# Patient Record
Sex: Female | Born: 1990 | Race: Black or African American | Hispanic: No | Marital: Single | Smoking: Former smoker
Health system: Southern US, Community
[De-identification: ages and names within clinical notes are randomized; demographics above are authoritative.]

## PROBLEM LIST (undated history)

## (undated) ENCOUNTER — Inpatient Hospital Stay: Payer: Self-pay

## (undated) ENCOUNTER — Inpatient Hospital Stay (HOSPITAL_COMMUNITY): Payer: Self-pay

## (undated) DIAGNOSIS — Z9889 Other specified postprocedural states: Secondary | ICD-10-CM

## (undated) DIAGNOSIS — R519 Headache, unspecified: Secondary | ICD-10-CM

## (undated) DIAGNOSIS — R51 Headache: Secondary | ICD-10-CM

## (undated) DIAGNOSIS — R112 Nausea with vomiting, unspecified: Secondary | ICD-10-CM

## (undated) DIAGNOSIS — Z3490 Encounter for supervision of normal pregnancy, unspecified, unspecified trimester: Secondary | ICD-10-CM

## (undated) DIAGNOSIS — D649 Anemia, unspecified: Secondary | ICD-10-CM

## (undated) DIAGNOSIS — R87629 Unspecified abnormal cytological findings in specimens from vagina: Secondary | ICD-10-CM

## (undated) DIAGNOSIS — C539 Malignant neoplasm of cervix uteri, unspecified: Secondary | ICD-10-CM

## (undated) HISTORY — PX: LEEP: SHX91

---
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
## 2012-01-25 ENCOUNTER — Emergency Department: Payer: Self-pay | Admitting: Emergency Medicine

## 2012-05-08 ENCOUNTER — Emergency Department: Payer: Self-pay | Admitting: Emergency Medicine

## 2012-09-22 ENCOUNTER — Emergency Department: Payer: Self-pay | Admitting: Emergency Medicine

## 2012-09-22 LAB — CBC
HCT: 38 % (ref 35.0–47.0)
HGB: 12.9 g/dL (ref 12.0–16.0)
MCH: 29.7 pg (ref 26.0–34.0)
MCHC: 34.1 g/dL (ref 32.0–36.0)
MCV: 87 fL (ref 80–100)
RDW: 12.5 % (ref 11.5–14.5)
WBC: 9 10*3/uL (ref 3.6–11.0)

## 2012-09-22 LAB — URINALYSIS, COMPLETE
Bilirubin,UR: NEGATIVE
Blood: NEGATIVE
Glucose,UR: NEGATIVE mg/dL (ref 0–75)
Ketone: NEGATIVE
Nitrite: NEGATIVE
Protein: NEGATIVE
Specific Gravity: 1.015 (ref 1.003–1.030)
Squamous Epithelial: <1
WBC UR: <1 /HPF (ref 0–5)

## 2012-09-22 LAB — COMPREHENSIVE METABOLIC PANEL
Alkaline Phosphatase: 80 U/L (ref 50–136)
Anion Gap: 8 (ref 7–16)
Calcium, Total: 9.2 mg/dL (ref 8.5–10.1)
Chloride: 112 mmol/L — ABNORMAL HIGH (ref 98–107)
Co2: 22 mmol/L (ref 21–32)
EGFR (African American): >60
Osmolality: 283 (ref 275–301)
SGOT(AST): 20 U/L (ref 15–37)
Sodium: 142 mmol/L (ref 136–145)

## 2012-09-22 LAB — LIPASE, BLOOD: Lipase: 156 U/L (ref 73–393)

## 2012-09-23 ENCOUNTER — Emergency Department: Payer: Self-pay | Admitting: Emergency Medicine

## 2013-05-07 ENCOUNTER — Emergency Department: Payer: Self-pay | Admitting: Emergency Medicine

## 2013-09-05 ENCOUNTER — Ambulatory Visit: Payer: Self-pay | Admitting: Physician Assistant

## 2013-09-05 LAB — PREGNANCY, URINE: Pregnancy Test, Urine: NEGATIVE m[IU]/mL

## 2013-09-05 LAB — RAPID STREP-A WITH REFLX: Micro Text Report: NEGATIVE

## 2013-09-08 LAB — BETA STREP CULTURE(ARMC)

## 2013-11-05 ENCOUNTER — Ambulatory Visit: Payer: Self-pay | Admitting: Family Medicine

## 2013-11-05 LAB — URINALYSIS, COMPLETE
Bilirubin,UR: NEGATIVE
Blood: NEGATIVE
Glucose,UR: NEGATIVE mg/dL (ref 0–75)
KETONE: NEGATIVE
Leukocyte Esterase: NEGATIVE
Nitrite: NEGATIVE
PROTEIN: NEGATIVE
Ph: 7 (ref 4.5–8.0)
SPECIFIC GRAVITY: 1.01 (ref 1.003–1.030)

## 2013-11-05 LAB — RAPID INFLUENZA A&B ANTIGENS (ARMC ONLY)

## 2013-11-05 LAB — PREGNANCY, URINE: Pregnancy Test, Urine: NEGATIVE m[IU]/mL

## 2013-11-08 ENCOUNTER — Emergency Department: Payer: Self-pay | Admitting: Emergency Medicine

## 2013-11-08 LAB — URINE CULTURE

## 2014-06-30 ENCOUNTER — Emergency Department: Payer: Self-pay | Admitting: Emergency Medicine

## 2014-08-17 DIAGNOSIS — E663 Overweight: Secondary | ICD-10-CM | POA: Insufficient documentation

## 2014-08-17 LAB — OB RESULTS CONSOLE HIV ANTIBODY (ROUTINE TESTING): HIV: NONREACTIVE

## 2014-08-17 LAB — HM HIV SCREENING LAB: HM HIV Screening: NEGATIVE

## 2014-08-17 LAB — OB RESULTS CONSOLE HEPATITIS B SURFACE ANTIGEN: HEP B S AG: NEGATIVE

## 2014-09-14 ENCOUNTER — Encounter: Payer: Self-pay | Admitting: Maternal & Fetal Medicine

## 2014-09-23 DIAGNOSIS — R87619 Unspecified abnormal cytological findings in specimens from cervix uteri: Secondary | ICD-10-CM | POA: Insufficient documentation

## 2014-09-23 DIAGNOSIS — O9A419 Sexual abuse complicating pregnancy, unspecified trimester: Secondary | ICD-10-CM

## 2014-09-23 DIAGNOSIS — G43909 Migraine, unspecified, not intractable, without status migrainosus: Secondary | ICD-10-CM | POA: Insufficient documentation

## 2014-09-23 HISTORY — DX: Sexual abuse complicating pregnancy, unspecified trimester: O9A.419

## 2014-09-30 LAB — OB RESULTS CONSOLE RUBELLA ANTIBODY, IGM
Rubella: IMMUNE
Rubella: NON-IMMUNE/NOT IMMUNE

## 2014-09-30 LAB — OB RESULTS CONSOLE VARICELLA ZOSTER ANTIBODY, IGG: Varicella: IMMUNE

## 2014-12-17 ENCOUNTER — Encounter: Payer: Self-pay | Admitting: Maternal & Fetal Medicine

## 2015-01-07 ENCOUNTER — Encounter
Admit: 2015-01-07 | Disposition: A | Discharge status: Home / Self Care | Payer: Self-pay | Attending: Obstetrics & Gynecology | Admitting: Obstetrics & Gynecology

## 2015-01-21 ENCOUNTER — Encounter
Admit: 2015-01-21 | Disposition: A | Discharge status: Home / Self Care | Payer: Self-pay | Attending: Obstetrics and Gynecology | Admitting: Obstetrics and Gynecology

## 2015-01-24 NOTE — Consult Note (Signed)
Referral Information:  Reason for Referral 24 yo gravida 2 para 0010 at 25 weeks 4 days gestation by [redacted]w[redacted]d ultrasound is referred due to suspected 2 vessel cord.   Prenatal Hx First trimester screening 09/14/2014 - normal   Past Obstetrical Hx 05/09/2009 - SAb   Home Medications: Medication Instructions Status  Prenatal Multivitamins Prenatal Multivitamins oral tablet 1 tab(s) orally once a day Active   Allergies:   No Known Allergies:   Vital Signs/Notes:  Nursing Vital Signs: **Vital Signs.:   24-Mar-16 13:13  Vital Signs Type Routine  Temperature Temperature (F) 98.4  Celsius 36.8  Temperature Source oral  Pulse Pulse 105  Respirations Respirations 18  Systolic BP Systolic BP 389  Diastolic BP (mmHg) Diastolic BP (mmHg) 52  Mean BP 71  Pulse Ox % Pulse Ox % 97  Pulse Ox Activity Level  At rest  Oxygen Delivery Room Air/ 21 %   Perinatal Consult:  PGyn Hx abnormal Paps  LEEP  LEEP in 2013; Normal Paps subsequently    FHx Mother with DM, HTN; Father alive and well; Sister with GDM; MGM with history of stroke   Occupation Mother unemployed   Occupation Father works in [place]   Soc Hx occ [place]:  Subjective No cramping, leaking of water or bleeding; good fetal movement   Fever/Chills No   Cough No   Abdominal Pain No   Diarrhea No   Constipation No   Nausea/Vomiting No   SOB/DOE No   Chest Pain No   Dysuria No   Tolerating Diet Yes   Exam:  Today's Weight 219lbs;BMI=35    Additional Lab/Radiology Notes Early glucose screen was < 140  On ultrasound today, fetal anatomy is visualized and appears normal.  While the fetal umbilical arteries are difficult to visualize in cross sectional views of the umbilical cord, two umbilical arteries are visualized by Doppler ultrasound straddling the fetal bladder.    The estimated fetal weight, however is at the 6th percentile.  Amniotic fluid volume appears normal with a  MVP of 5.0 cm.  Umbilical artery Doppler S/D ratios averaged 3.03.  The upper limit of normal for this gestational age is 5.26.   Impression/Recommendations:  Impression 24 yo gravida 2 para 0010 at 25 weeks 4 days gestation by [redacted]w[redacted]d ultrasound with: 1. Fetal growth restriction 2. Obesity (BMI = 35) 3. Family history of diabetes (mother; sister with GDM)   Recommendations 1. Fetal growth restriction -- I recommended she reduce her activity and avoid smoke exposure. -- I recommended fetal surveillance to include a follow-up ultrasound for growth in 3 weeks, twice weekly testing starting now.  -- She can return here weekly for AFI, Dopplers and BPP, and to [facility] weekly for BPP. 2. Obesity (BMI = 35) -- Fetal surveillance as outline above and below 3. Family history of diabetes (mother; sister with GDM) -- She will have a follow-up glucose screen   Plan:  Ultrasound at what gestational ages Every 3 weeks starting now   Antepartum Testing Twice weekly, BPP twice a week until 30 weeks, then NSTs; AFI and Dopplers weekly   Comment/Plan Thank you for allowing [place] to participate in her care.    Total Time Spent with Patient 45 minutes   >50% of visit spent in couseling/coordination of care yes   Office Use Only [id number]  Level 3 (52min) NEW office consult detailed   Coding Description: FETAL - 2nd/3rd TRIMESTER INDICATION(S).   FGR -  Poor Fetal Growth.  Electronic Signatures: Dellia Nims (MD)  (Signed 24-Mar-16 15:03)  Authored: Referral, Home Medications, Allergies, Vital Signs/Notes, Consult, Exam, Lab/Radiology Notes, Impression, Plan, Billing, Coding Description   Last Updated: 24-Mar-16 15:03 by Dellia Nims (MD)

## 2015-01-28 ENCOUNTER — Ambulatory Visit
Admission: RE | Admit: 2015-01-28 | Discharge: 2015-01-28 | Disposition: A | Discharge status: Home / Self Care | Payer: Medicaid Other | Source: Ambulatory Visit | Attending: Obstetrics and Gynecology | Admitting: Obstetrics and Gynecology

## 2015-01-28 DIAGNOSIS — Z3A31 31 weeks gestation of pregnancy: Secondary | ICD-10-CM | POA: Insufficient documentation

## 2015-01-28 DIAGNOSIS — Q270 Congenital absence and hypoplasia of umbilical artery: Secondary | ICD-10-CM

## 2015-01-28 DIAGNOSIS — O26893 Other specified pregnancy related conditions, third trimester: Secondary | ICD-10-CM | POA: Insufficient documentation

## 2015-01-28 LAB — US OB FOLLOW UP

## 2015-02-10 ENCOUNTER — Observation Stay
Admission: EM | Admit: 2015-02-10 | Discharge: 2015-02-10 | Disposition: A | Discharge status: Home / Self Care | Payer: Medicaid Other | Attending: Obstetrics and Gynecology | Admitting: Obstetrics and Gynecology

## 2015-02-10 DIAGNOSIS — Z3A35 35 weeks gestation of pregnancy: Secondary | ICD-10-CM | POA: Diagnosis not present

## 2015-02-10 DIAGNOSIS — O26893 Other specified pregnancy related conditions, third trimester: Secondary | ICD-10-CM | POA: Diagnosis not present

## 2015-02-10 DIAGNOSIS — R109 Unspecified abdominal pain: Secondary | ICD-10-CM | POA: Insufficient documentation

## 2015-02-10 HISTORY — DX: Unspecified abnormal cytological findings in specimens from vagina: R87.629

## 2015-02-10 NOTE — OB Triage Note (Signed)
Preterm d/c instructions reviewed. AVS given and discussed. Pt d/c home.

## 2015-02-10 NOTE — Discharge Instructions (Signed)
Call provider or return to birthplace with:  1. Regular contractions 2. Leaking of fluid from your vaginal 3. Vaginal bleeding: Bright red or heavy like a period 4. Decreased Fetal movement

## 2015-02-10 NOTE — OB Triage Note (Signed)
Pt prenents to L&D with c/o constant abdominal pain acrross upper abd after vomiting episode x1 at 0000. Pt denies leaking fluid vaginal bleeding and reports fetal movement

## 2015-03-04 LAB — OB RESULTS CONSOLE GC/CHLAMYDIA
CHLAMYDIA, DNA PROBE: NEGATIVE
Gonorrhea: NEGATIVE

## 2015-03-04 LAB — OB RESULTS CONSOLE GBS: GBS: NEGATIVE

## 2015-03-04 LAB — OB RESULTS CONSOLE RPR: RPR: NONREACTIVE

## 2015-03-16 ENCOUNTER — Observation Stay
Admission: EM | Admit: 2015-03-16 | Discharge: 2015-03-16 | Disposition: A | Discharge status: Home / Self Care | Payer: Medicaid Other | Attending: Obstetrics and Gynecology | Admitting: Obstetrics and Gynecology

## 2015-03-16 DIAGNOSIS — R109 Unspecified abdominal pain: Secondary | ICD-10-CM | POA: Diagnosis present

## 2015-03-16 DIAGNOSIS — O26899 Other specified pregnancy related conditions, unspecified trimester: Secondary | ICD-10-CM | POA: Diagnosis not present

## 2015-03-16 NOTE — Discharge Instructions (Signed)
Braxton Hicks Contractions °Contractions of the uterus can occur throughout pregnancy. Contractions are not always a sign that you are in labor.  °WHAT ARE BRAXTON HICKS CONTRACTIONS?  °Contractions that occur before labor are called Braxton Hicks contractions, or false labor. Toward the end of pregnancy (32-34 weeks), these contractions can develop more often and may become more forceful. This is not true labor because these contractions do not result in opening (dilatation) and thinning of the cervix. They are sometimes difficult to tell apart from true labor because these contractions can be forceful and people have different pain tolerances. You should not feel embarrassed if you go to the hospital with false labor. Sometimes, the only way to tell if you are in true labor is for your health care provider to look for changes in the cervix. °If there are no prenatal problems or other health problems associated with the pregnancy, it is completely safe to be sent home with false labor and await the onset of true labor. °HOW CAN YOU TELL THE DIFFERENCE BETWEEN TRUE AND FALSE LABOR? °False Labor °· The contractions of false labor are usually shorter and not as hard as those of true labor.   °· The contractions are usually irregular.   °· The contractions are often felt in the front of the lower abdomen and in the groin.   °· The contractions may go away when you walk around or change positions while lying down.   °· The contractions get weaker and are shorter lasting as time goes on.   °· The contractions do not usually become progressively stronger, regular, and closer together as with true labor.   °True Labor °· Contractions in true labor last 30-70 seconds, become very regular, usually become more intense, and increase in frequency.   °· The contractions do not go away with walking.   °· The discomfort is usually felt in the top of the uterus and spreads to the lower abdomen and low back.   °· True labor can be  determined by your health care provider with an exam. This will show that the cervix is dilating and getting thinner.   °WHAT TO REMEMBER °· Keep up with your usual exercises and follow other instructions given by your health care provider.   °· Take medicines as directed by your health care provider.   °· Keep your regular prenatal appointments.   °· Eat and drink lightly if you think you are going into labor.   °· If Braxton Hicks contractions are making you uncomfortable:   °¨ Change your position from lying down or resting to walking, or from walking to resting.   °¨ Sit and rest in a tub of warm water.   °¨ Drink 2-3 glasses of water. Dehydration may cause these contractions.   °¨ Do slow and deep breathing several times an hour.   °WHEN SHOULD I SEEK IMMEDIATE MEDICAL CARE? °Seek immediate medical care if: °· Your contractions become stronger, more regular, and closer together.   °· You have fluid leaking or gushing from your vagina.   °· You have a fever.   °· You pass blood-tinged mucus.   °· You have vaginal bleeding.   °· You have continuous abdominal pain.   °· You have low back pain that you never had before.   °· You feel your baby's head pushing down and causing pelvic pressure.   °· Your baby is not moving as much as it used to.   °Document Released: 09/11/2005 Document Revised: 09/16/2013 Document Reviewed: 06/23/2013 °ExitCare® Patient Information ©2015 [employer]. This information is not intended to replace advice given to you by your health care   provider. Make sure you discuss any questions you have with your health care provider. ° °

## 2015-03-16 NOTE — OB Triage Note (Signed)
Patient complains of lower abdominal pain that is sharp.  Increases when baby moves.  Patient states, "I don't know if I'm having contractions."

## 2015-04-06 ENCOUNTER — Inpatient Hospital Stay
Admission: EM | Admit: 2015-04-06 | Discharge: 2015-04-12 | DRG: 766 | Disposition: A | Discharge status: Home / Self Care | Payer: Medicaid Other | Attending: Obstetrics and Gynecology | Admitting: Obstetrics and Gynecology

## 2015-04-06 DIAGNOSIS — O480 Post-term pregnancy: Principal | ICD-10-CM | POA: Diagnosis present

## 2015-04-06 DIAGNOSIS — Z3A41 41 weeks gestation of pregnancy: Secondary | ICD-10-CM | POA: Diagnosis present

## 2015-04-06 DIAGNOSIS — Z833 Family history of diabetes mellitus: Secondary | ICD-10-CM | POA: Diagnosis not present

## 2015-04-06 DIAGNOSIS — K660 Peritoneal adhesions (postprocedural) (postinfection): Secondary | ICD-10-CM | POA: Diagnosis present

## 2015-04-06 DIAGNOSIS — Z8249 Family history of ischemic heart disease and other diseases of the circulatory system: Secondary | ICD-10-CM | POA: Diagnosis present

## 2015-04-06 DIAGNOSIS — Z87891 Personal history of nicotine dependence: Secondary | ICD-10-CM

## 2015-04-06 DIAGNOSIS — Z809 Family history of malignant neoplasm, unspecified: Secondary | ICD-10-CM | POA: Diagnosis not present

## 2015-04-06 DIAGNOSIS — Z98891 History of uterine scar from previous surgery: Secondary | ICD-10-CM

## 2015-04-06 LAB — CBC
HCT: 35 % (ref 35.0–47.0)
HEMOGLOBIN: 11.8 g/dL — AB (ref 12.0–16.0)
MCH: 30.4 pg (ref 26.0–34.0)
MCHC: 33.8 g/dL (ref 32.0–36.0)
MCV: 89.7 fL (ref 80.0–100.0)
Platelets: 297 10*3/uL (ref 150–440)
RBC: 3.9 MIL/uL (ref 3.80–5.20)
RDW: 13.5 % (ref 11.5–14.5)
WBC: 8.8 10*3/uL (ref 3.6–11.0)

## 2015-04-06 MED ORDER — TERBUTALINE SULFATE 1 MG/ML IJ SOLN: 0.2500 mg | Freq: Once | INTRAMUSCULAR | Status: AC | PRN

## 2015-04-06 MED ORDER — OXYTOCIN 40 UNITS IN LACTATED RINGERS INFUSION - SIMPLE MED
1.0000 m[IU]/min | INTRAVENOUS | Status: DC
Start: 2015-04-06 — End: 2015-04-09
  Administered 2015-04-08: 1 m[IU]/min via INTRAVENOUS
  Administered 2015-04-08: 10 m[IU]/min via INTRAVENOUS
  Administered 2015-04-08: 7 m[IU]/min via INTRAVENOUS
  Administered 2015-04-09: 1000 mL via INTRAVENOUS
  Filled 2015-04-06 (×2): qty 1000

## 2015-04-06 MED ORDER — LIDOCAINE HCL (PF) 1 % IJ SOLN
30.0000 mL | INTRAMUSCULAR | Status: DC | PRN
  Filled 2015-04-06: qty 30

## 2015-04-06 MED ORDER — BUTORPHANOL TARTRATE 1 MG/ML IJ SOLN: 1.0000 mg | INTRAMUSCULAR | Status: DC | PRN

## 2015-04-06 MED ORDER — OXYTOCIN BOLUS FROM INFUSION
500.0000 mL | INTRAVENOUS | Status: DC
Start: 2015-04-06 — End: 2015-04-09

## 2015-04-06 MED ORDER — LACTATED RINGERS IV SOLN: 500.0000 mL | INTRAVENOUS | Status: DC | PRN

## 2015-04-06 MED ORDER — LACTATED RINGERS IV SOLN
INTRAVENOUS | Status: DC
  Administered 2015-04-06 – 2015-04-08 (×4): via INTRAVENOUS

## 2015-04-06 MED ORDER — DINOPROSTONE 10 MG VA INST
10.0000 mg | VAGINAL_INSERT | Freq: Once | VAGINAL | Status: AC
  Administered 2015-04-06: 10 mg via VAGINAL
  Filled 2015-04-06: qty 1

## 2015-04-06 MED ORDER — OXYTOCIN 40 UNITS IN LACTATED RINGERS INFUSION - SIMPLE MED: 62.5000 mL/h | INTRAVENOUS | Status: DC

## 2015-04-07 ENCOUNTER — Other Ambulatory Visit: Payer: Self-pay | Admitting: Obstetrics and Gynecology

## 2015-04-07 LAB — CHLAMYDIA/NGC RT PCR (ARMC ONLY)
Chlamydia Tr: NOT DETECTED
N GONORRHOEAE: NOT DETECTED

## 2015-04-07 LAB — TYPE AND SCREEN
ABO/RH(D): O POS
ANTIBODY SCREEN: NEGATIVE

## 2015-04-07 MED ORDER — AMMONIA AROMATIC IN INHA
RESPIRATORY_TRACT | Status: AC
  Filled 2015-04-07: qty 10

## 2015-04-07 MED ORDER — DINOPROSTONE 10 MG VA INST
10.0000 mg | VAGINAL_INSERT | Freq: Once | VAGINAL | Status: AC
  Administered 2015-04-07: 10 mg via VAGINAL
  Filled 2015-04-07: qty 1

## 2015-04-07 MED ORDER — OXYTOCIN 10 UNIT/ML IJ SOLN
INTRAMUSCULAR | Status: AC
  Filled 2015-04-07: qty 2

## 2015-04-07 MED ORDER — MISOPROSTOL 200 MCG PO TABS
ORAL_TABLET | ORAL | Status: AC
  Filled 2015-04-07: qty 4

## 2015-04-07 NOTE — Progress Notes (Signed)
Patient ID: Caitlin Young, female   DOB: 03-May-1991, 24 y.o.   MRN: [id number] Pt with cervidil throughout day ( today replaced at 1212). Ctx , but not feeling .  Reassuring fetal monitoring  Cont induction . Allow to eat tonight then back to npo status  Recheck in am for need of cytotec / pitocin/ ROM

## 2015-04-07 NOTE — H&P (Signed)
Caitlin Young is a 24 y.o. female presenting for postdates induction . Was brought in on 04/06/15. Historyh/o growth restriction this pregnancy , resolved on most recent u/s , rubella NI , h/o LEEP , drug abuse remote ,  OB History    Gravida Para Term Preterm AB TAB SAB Ectopic Multiple Living   2 0 0 0 1 0 1 0 0 0      Past Medical History  Diagnosis Date  . Vaginal Pap smear, abnormal    History reviewed. No pertinent past surgical history. Family History: family history includes Cancer in her maternal grandmother; Diabetes in her maternal grandmother and mother; Hypertension in her maternal grandmother and mother. Social History:  reports that she has quit smoking. She does not have any smokeless tobacco history on file. She reports that she does not drink alcohol or use illicit drugs.   Prenatal Transfer Tool  Maternal Diabetes: No Genetic Screening: Normal Maternal Ultrasounds/Referrals: Abnormal:  Findings:   Other:2 vessel ---. 3 vessel then seen on mor recent , sga ---> resolved  Fetal Ultrasounds or other Referrals:  None Maternal Substance Abuse:  Yes:  Type: Marijuana Significant Maternal Medications:  None Significant Maternal Lab Results:  Lab values include: Other: rubella NI Other Comments:  None  ROS  Dilation: Closed Effacement (%): 70 Station: -3 Exam by:: Dr. Ouida Sills  Blood pressure 113/63, pulse 102, temperature 98.1 F (36.7 C), temperature source Oral, resp. rate 20, height 5\' 7"  (1.702 m), weight 218 lb (98.884 kg). Exam Physical Exam  Prenatal labs: ABO, Rh: --/--/O POS (07/12 2249) Antibody: NEG (07/12 2249) Rubella: Nonimmune (01/06 0000) RPR: Nonreactive (06/09 0000)  HBsAg:    HIV:    GBS: Negative (06/09 0000)   Assessment/Plan: DAy 32 induction , cervidil pulled . Pt will eat and then cervidil will be replaced .  Reassuring fetal monitoring  Drug screen    SCHERMERHORN,THOMAS 04/07/2015, 9:26 AM

## 2015-04-07 NOTE — Plan of Care (Signed)
Dr. Ouida Sills to room to pull cervidil. Patient found to be closed and thin. May eat breakfast. Plan to place 2nd cervidil per protocol. Deanna Artis, RN

## 2015-04-08 LAB — RPR: RPR: NONREACTIVE

## 2015-04-08 MED ORDER — SODIUM CHLORIDE 0.9 % IJ SOLN
INTRAMUSCULAR | Status: AC
  Filled 2015-04-08: qty 50

## 2015-04-08 NOTE — Progress Notes (Signed)
Caitlin Young is a 24 y.o. G2P0010 at [redacted]w[redacted]d here for PD IOL. S/p cervadil x 2.   Subjective: Doing well. Some cramping but not much pain.   Objective: BP 116/67 mmHg  Pulse 90  Temp(Src) 98.4 F (36.9 C) (Oral)  Resp 18  Ht 5\' 7"  (1.702 m)  Wt 98.884 kg (218 lb)  BMI 34.14 kg/m2 I/O last 3 completed shifts: In: 2604.2 [I.V.:2604.2] Out: -     FHT:  FHR: 145 bpm, variability: moderate,  accelerations:  Present,  decelerations:  Absent UC:   none SVE:   1/70/-3  Foley bulb placed w/ 30cc saline  Labs: Lab Results  Component Value Date   WBC 8.8 04/06/2015   HGB 11.8* 04/06/2015   HCT 35.0 04/06/2015   MCV 89.7 04/06/2015   PLT 297 04/06/2015    Assessment / Plan: PD IOL, still in need of cervical ripening.   Plan: - FB placed at 8am - Initiate pitocin low-dose  Lorette Ang 04/08/2015, 9:43 AM

## 2015-04-08 NOTE — Progress Notes (Signed)
Patient request to discontinue pitocin and cervical ripening balloon.  Patient wants rest tonight and c/s tomorrow.  MD notified.

## 2015-04-08 NOTE — Plan of Care (Signed)
Discussed plan of care with OB doctor.  Plan to allow patient to rest tonight; will assess in am for induction plan.  May discontinue fhm and toco overnight.  Reapply monitors and reassess with s/s of labor tonight.

## 2015-04-08 NOTE — Progress Notes (Signed)
Deceleration with fhr down to 90's x 2 min. Pitocin turned of. Pt repositioned to right side.  Tugged on foley bulb. Foley bulb still intact.

## 2015-04-08 NOTE — Progress Notes (Signed)
Caitlin Young is a 24 y.o. G2P0010 at [redacted]w[redacted]d by LMP admitted for induction of labor due to Post dates. Due date  Subjective: Day #3 induction now with foley bulb and pitocin at 9 mu/min . Recent variable decels noted  Objective: BP 115/66 mmHg  Pulse 69  Temp(Src) 98.4 F (36.9 C) (Oral)  Resp 18  Ht 5\' 7"  (1.702 m)  Wt 218 lb (98.884 kg)  BMI 34.14 kg/m2 I/O last 3 completed shifts: In: 2604.2 [I.V.:2604.2] Out: -     FHT:  Variable decel , n ow 140 reassuring  UC:   regular, every 4 minutes SVE:   Dilation: 1 Effacement (%): 70 Station: -3  By [employer]: Lab Results  Component Value Date   WBC 8.8 04/06/2015   HGB 11.8* 04/06/2015   HCT 35.0 04/06/2015   MCV 89.7 04/06/2015   PLT 297 04/06/2015    Assessment / Plan: postdates induction , slow progression   Continue pitocin , recheck later  [place] 04/08/2015, 2:00 PM

## 2015-04-08 NOTE — Progress Notes (Signed)
Discussed plan of care with dr schermerhorn. Pitocin turned off. Will allow pt to eat and attend to personal care. Restart pitocin at 2100.

## 2015-04-08 NOTE — Plan of Care (Signed)
Dr schermerhorn in to check on pt. Reviewed monitor strip and viewed variable decel. Position changed to left side. Explained to pt and family the function of foley bulb catheter balloon . Also explained cervical scar tissue with LEEP procedure.

## 2015-04-09 ENCOUNTER — Inpatient Hospital Stay: Payer: Medicaid Other | Admitting: Anesthesiology

## 2015-04-09 ENCOUNTER — Encounter
Admission: EM | Disposition: A | Discharge status: Home / Self Care | Payer: Self-pay | Source: Home / Self Care | Attending: Obstetrics and Gynecology

## 2015-04-09 DIAGNOSIS — Z98891 History of uterine scar from previous surgery: Secondary | ICD-10-CM

## 2015-04-09 LAB — BASIC METABOLIC PANEL
ANION GAP: 7 (ref 5–15)
BUN: 5 mg/dL — AB (ref 6–20)
CALCIUM: 9 mg/dL (ref 8.9–10.3)
CO2: 21 mmol/L — AB (ref 22–32)
CREATININE: 0.55 mg/dL (ref 0.44–1.00)
Chloride: 108 mmol/L (ref 101–111)
GFR calc Af Amer: >60 mL/min (ref >60)
GFR calc non Af Amer: >60 mL/min (ref >60)
GLUCOSE: 80 mg/dL (ref 65–99)
POTASSIUM: 3.8 mmol/L (ref 3.5–5.1)
Sodium: 136 mmol/L (ref 135–145)

## 2015-04-09 LAB — CBC
HEMATOCRIT: 35 % (ref 35.0–47.0)
HEMOGLOBIN: 11.8 g/dL — AB (ref 12.0–16.0)
MCH: 30.3 pg (ref 26.0–34.0)
MCHC: 33.7 g/dL (ref 32.0–36.0)
MCV: 90 fL (ref 80.0–100.0)
Platelets: 278 10*3/uL (ref 150–440)
RBC: 3.89 MIL/uL (ref 3.80–5.20)
RDW: 13.6 % (ref 11.5–14.5)
WBC: 8.7 10*3/uL (ref 3.6–11.0)

## 2015-04-09 SURGERY — Surgical Case
Anesthesia: Spinal | Wound class: Clean Contaminated

## 2015-04-09 MED ORDER — SCOPOLAMINE 1 MG/3DAYS TD PT72
1.0000 | MEDICATED_PATCH | Freq: Once | TRANSDERMAL | Status: DC
  Filled 2015-04-09: qty 1

## 2015-04-09 MED ORDER — KETOROLAC TROMETHAMINE 30 MG/ML IJ SOLN
30.0000 mg | Freq: Four times a day (QID) | INTRAMUSCULAR | Status: DC | PRN
  Filled 2015-04-09: qty 1

## 2015-04-09 MED ORDER — BUPIVACAINE HCL (PF) 0.5 % IJ SOLN
INTRAMUSCULAR | Status: DC | PRN
  Administered 2015-04-09: 10 mL

## 2015-04-09 MED ORDER — ONDANSETRON HCL 4 MG/2ML IJ SOLN
INTRAMUSCULAR | Status: DC | PRN
  Administered 2015-04-09: 4 mg via INTRAVENOUS

## 2015-04-09 MED ORDER — NALBUPHINE HCL 10 MG/ML IJ SOLN
5.0000 mg | Freq: Once | INTRAMUSCULAR | Status: AC | PRN
  Filled 2015-04-09: qty 0.5

## 2015-04-09 MED ORDER — DIPHENHYDRAMINE HCL 25 MG PO CAPS: 25.0000 mg | ORAL_CAPSULE | ORAL | Status: DC | PRN

## 2015-04-09 MED ORDER — FLEET ENEMA 7-19 GM/118ML RE ENEM: 1.0000 | ENEMA | Freq: Every day | RECTAL | Status: DC | PRN

## 2015-04-09 MED ORDER — SODIUM CHLORIDE 0.9 % IJ SOLN: 3.0000 mL | INTRAMUSCULAR | Status: DC | PRN

## 2015-04-09 MED ORDER — CITRIC ACID-SODIUM CITRATE 334-500 MG/5ML PO SOLN
30.0000 mL | Freq: Once | ORAL | Status: AC
  Administered 2015-04-09: 30 mL via ORAL

## 2015-04-09 MED ORDER — PHENYLEPHRINE HCL 10 MG/ML IJ SOLN
INTRAMUSCULAR | Status: DC | PRN
  Administered 2015-04-09 (×5): 100 ug via INTRAVENOUS

## 2015-04-09 MED ORDER — NALOXONE HCL 0.4 MG/ML IJ SOLN: 0.4000 mg | INTRAMUSCULAR | Status: DC | PRN

## 2015-04-09 MED ORDER — DIPHENHYDRAMINE HCL 25 MG PO CAPS
25.0000 mg | ORAL_CAPSULE | Freq: Four times a day (QID) | ORAL | Status: DC | PRN
  Filled 2015-04-09: qty 1

## 2015-04-09 MED ORDER — KETOROLAC TROMETHAMINE 30 MG/ML IJ SOLN
30.0000 mg | Freq: Four times a day (QID) | INTRAMUSCULAR | Status: DC | PRN
  Administered 2015-04-09: 30 mg via INTRAVENOUS
  Filled 2015-04-09: qty 1

## 2015-04-09 MED ORDER — PRENATAL MULTIVITAMIN CH
1.0000 | ORAL_TABLET | Freq: Every day | ORAL | Status: DC
  Administered 2015-04-10 – 2015-04-11 (×2): 1 via ORAL
  Filled 2015-04-09 (×2): qty 1

## 2015-04-09 MED ORDER — CITRIC ACID-SODIUM CITRATE 334-500 MG/5ML PO SOLN
ORAL | Status: AC
  Administered 2015-04-09: 30 mL via ORAL
  Filled 2015-04-09: qty 15

## 2015-04-09 MED ORDER — BUPIVACAINE 0.25 % ON-Q PUMP DUAL CATH 400 ML
INJECTION | Status: AC
  Filled 2015-04-09: qty 400

## 2015-04-09 MED ORDER — TETANUS-DIPHTH-ACELL PERTUSSIS 5-2.5-18.5 LF-MCG/0.5 IM SUSP: 0.5000 mL | Freq: Once | INTRAMUSCULAR | Status: DC

## 2015-04-09 MED ORDER — ONDANSETRON HCL 4 MG/2ML IJ SOLN: 4.0000 mg | Freq: Three times a day (TID) | INTRAMUSCULAR | Status: DC | PRN

## 2015-04-09 MED ORDER — MORPHINE SULFATE (PF) 0.5 MG/ML IJ SOLN
INTRAMUSCULAR | Status: DC | PRN
  Administered 2015-04-09: .2 mg via EPIDURAL

## 2015-04-09 MED ORDER — CEFAZOLIN SODIUM-DEXTROSE 2-3 GM-% IV SOLR
INTRAVENOUS | Status: AC
  Administered 2015-04-09: 2 g via INTRAVENOUS
  Filled 2015-04-09: qty 50

## 2015-04-09 MED ORDER — ACETAMINOPHEN 325 MG PO TABS
650.0000 mg | ORAL_TABLET | Freq: Three times a day (TID) | ORAL | Status: DC | PRN
  Administered 2015-04-09: 650 mg via ORAL
  Filled 2015-04-09: qty 2

## 2015-04-09 MED ORDER — BISACODYL 10 MG RE SUPP: 10.0000 mg | Freq: Every day | RECTAL | Status: DC | PRN

## 2015-04-09 MED ORDER — CEFAZOLIN SODIUM-DEXTROSE 2-3 GM-% IV SOLR
2.0000 g | INTRAVENOUS | Status: AC
  Administered 2015-04-09: 2 g via INTRAVENOUS

## 2015-04-09 MED ORDER — PROMETHAZINE HCL 25 MG/ML IJ SOLN
12.5000 mg | Freq: Four times a day (QID) | INTRAMUSCULAR | Status: DC | PRN
  Administered 2015-04-09: 12.5 mg via INTRAMUSCULAR
  Filled 2015-04-09: qty 1

## 2015-04-09 MED ORDER — IBUPROFEN 600 MG PO TABS
600.0000 mg | ORAL_TABLET | Freq: Four times a day (QID) | ORAL | Status: DC
  Administered 2015-04-09 – 2015-04-11 (×7): 600 mg via ORAL
  Filled 2015-04-09 (×7): qty 1

## 2015-04-09 MED ORDER — SENNOSIDES-DOCUSATE SODIUM 8.6-50 MG PO TABS
2.0000 | ORAL_TABLET | ORAL | Status: DC
  Administered 2015-04-09 – 2015-04-11 (×3): 2 via ORAL
  Filled 2015-04-09 (×3): qty 2

## 2015-04-09 MED ORDER — MENTHOL 3 MG MT LOZG
1.0000 | LOZENGE | OROMUCOSAL | Status: DC | PRN
  Filled 2015-04-09: qty 9

## 2015-04-09 MED ORDER — SODIUM CHLORIDE 0.9 % IJ SOLN
INTRAMUSCULAR | Status: AC
  Filled 2015-04-09: qty 3

## 2015-04-09 MED ORDER — NALBUPHINE HCL 10 MG/ML IJ SOLN
5.0000 mg | INTRAMUSCULAR | Status: DC | PRN
  Filled 2015-04-09: qty 0.5

## 2015-04-09 MED ORDER — LANOLIN HYDROUS EX OINT: 1.0000 "application " | TOPICAL_OINTMENT | CUTANEOUS | Status: DC | PRN

## 2015-04-09 MED ORDER — DEXTROSE 5 % IV SOLN
1.0000 ug/kg/h | INTRAVENOUS | Status: DC | PRN
  Filled 2015-04-09: qty 2

## 2015-04-09 MED ORDER — BUPIVACAINE HCL (PF) 0.5 % IJ SOLN
INTRAMUSCULAR | Status: AC
  Filled 2015-04-09: qty 30

## 2015-04-09 MED ORDER — ACETAMINOPHEN 325 MG PO TABS: 650.0000 mg | ORAL_TABLET | ORAL | Status: DC | PRN

## 2015-04-09 MED ORDER — LACTATED RINGERS IV SOLN
INTRAVENOUS | Status: DC
  Administered 2015-04-09 – 2015-04-10 (×2): via INTRAVENOUS

## 2015-04-09 MED ORDER — OXYCODONE-ACETAMINOPHEN 5-325 MG PO TABS
1.0000 | ORAL_TABLET | ORAL | Status: DC | PRN
  Filled 2015-04-09 (×2): qty 1

## 2015-04-09 MED ORDER — DIPHENHYDRAMINE HCL 50 MG/ML IJ SOLN
12.5000 mg | INTRAMUSCULAR | Status: DC | PRN
  Administered 2015-04-09 – 2015-04-10 (×2): 12.5 mg via INTRAVENOUS
  Filled 2015-04-09 (×2): qty 1

## 2015-04-09 MED ORDER — OXYTOCIN 40 UNITS IN LACTATED RINGERS INFUSION - SIMPLE MED: 62.5000 mL/h | INTRAVENOUS | Status: AC

## 2015-04-09 MED ORDER — PRENATAL MULTIVITAMIN CH: 1.0000 | ORAL_TABLET | Freq: Every day | ORAL | Status: DC

## 2015-04-09 MED ORDER — DIBUCAINE 1 % RE OINT: 1.0000 "application " | TOPICAL_OINTMENT | RECTAL | Status: DC | PRN

## 2015-04-09 MED ORDER — MEPERIDINE HCL 25 MG/ML IJ SOLN: 6.2500 mg | INTRAMUSCULAR | Status: DC | PRN

## 2015-04-09 MED ORDER — WITCH HAZEL-GLYCERIN EX PADS: 1.0000 "application " | MEDICATED_PAD | CUTANEOUS | Status: DC | PRN

## 2015-04-09 MED ORDER — ONDANSETRON HCL 4 MG/2ML IJ SOLN
4.0000 mg | Freq: Once | INTRAMUSCULAR | Status: AC | PRN
  Administered 2015-04-09: 4 mg via INTRAVENOUS
  Filled 2015-04-09: qty 2

## 2015-04-09 MED ORDER — FENTANYL CITRATE (PF) 100 MCG/2ML IJ SOLN
25.0000 ug | INTRAMUSCULAR | Status: DC | PRN
  Administered 2015-04-09 (×4): 25 ug via INTRAVENOUS
  Filled 2015-04-09: qty 2

## 2015-04-09 MED ORDER — SIMETHICONE 80 MG PO CHEW
80.0000 mg | CHEWABLE_TABLET | ORAL | Status: DC | PRN
  Filled 2015-04-09: qty 1

## 2015-04-09 MED ORDER — SIMETHICONE 80 MG PO CHEW
80.0000 mg | CHEWABLE_TABLET | ORAL | Status: DC
  Administered 2015-04-09 – 2015-04-11 (×3): 80 mg via ORAL
  Filled 2015-04-09: qty 1

## 2015-04-09 MED ORDER — OXYCODONE-ACETAMINOPHEN 5-325 MG PO TABS
2.0000 | ORAL_TABLET | ORAL | Status: DC | PRN
  Administered 2015-04-10 – 2015-04-12 (×6): 2 via ORAL
  Filled 2015-04-09 (×5): qty 2

## 2015-04-09 MED ORDER — BUPIVACAINE IN DEXTROSE 0.75-8.25 % IT SOLN
INTRATHECAL | Status: DC | PRN
  Administered 2015-04-09: 1.6 mL via INTRATHECAL

## 2015-04-09 MED ORDER — MEASLES, MUMPS & RUBELLA VAC ~~LOC~~ INJ
0.5000 mL | INJECTION | Freq: Once | SUBCUTANEOUS | Status: DC
  Filled 2015-04-09: qty 0.5

## 2015-04-09 MED ORDER — SIMETHICONE 80 MG PO CHEW
80.0000 mg | CHEWABLE_TABLET | Freq: Three times a day (TID) | ORAL | Status: DC
  Administered 2015-04-10 – 2015-04-12 (×7): 80 mg via ORAL
  Filled 2015-04-09 (×8): qty 1

## 2015-04-09 MED ORDER — LORATADINE 10 MG PO TABS
10.0000 mg | ORAL_TABLET | Freq: Every day | ORAL | Status: DC
  Administered 2015-04-09 – 2015-04-12 (×4): 10 mg via ORAL
  Filled 2015-04-09 (×4): qty 1

## 2015-04-09 SURGICAL SUPPLY — 20 items
BARRIER ADHS 3X4 INTERCEED (GAUZE/BANDAGES/DRESSINGS) ×3 IMPLANT
CANISTER SUCT 3000ML (MISCELLANEOUS) ×3 IMPLANT
CATH KIT ON-Q SILVERSOAK 5IN (CATHETERS) ×3 IMPLANT
CHLORAPREP W/TINT 26ML (MISCELLANEOUS) ×3 IMPLANT
DRSG TELFA 3X8 NADH (GAUZE/BANDAGES/DRESSINGS) ×3 IMPLANT
GAUZE SPONGE 4X4 12PLY STRL (GAUZE/BANDAGES/DRESSINGS) ×3 IMPLANT
GOWN STRL REUS W/ TWL LRG LVL3 (GOWN DISPOSABLE) ×3 IMPLANT
GOWN STRL REUS W/TWL LRG LVL3 (GOWN DISPOSABLE) ×6
NS IRRIG 1000ML POUR BTL (IV SOLUTION) ×3 IMPLANT
PAD GROUND ADULT SPLIT (MISCELLANEOUS) ×3 IMPLANT
PAD OB MATERNITY 4.3X12.25 (PERSONAL CARE ITEMS) ×3 IMPLANT
PAD PREP 24X41 OB/GYN DISP (PERSONAL CARE ITEMS) ×3 IMPLANT
SUT MNCRL 4-0 (SUTURE)
SUT MNCRL 4-0 27XMFL (SUTURE)
SUT PLAIN GUT 2-0 30 C14 SG823 (SUTURE) ×3
SUT VIC AB 0 CT1 36 (SUTURE) ×6 IMPLANT
SUT VIC AB 3-0 SH 27 (SUTURE) ×2
SUT VIC AB 3-0 SH 27X BRD (SUTURE) ×1 IMPLANT
SUTURE MNCRL 4-0 27XMF (SUTURE) IMPLANT
SUTURE PLN GUT2-0 30 C14 SG823 (SUTURE) ×1 IMPLANT

## 2015-04-09 NOTE — Progress Notes (Signed)
Pt  Was admitted on 04/06/15 for late term induction. She is now 41+5wks. She is s/p 2 doses of cervadil, a foley bulb and pitocin for >24hrs. Fetal monitoring overall reassuring with occasional prolonged decelerations, spont return to baseline and accels in between.  After discussion with patient and family, we have decided to proceed with C/S for failed induction of labor. She was not in labor at any time.  She does plan on 2 children and is aware that multiple C/S increase surgical risk.  The risks of cesarean section discussed with the patient included but were not limited to: bleeding which may require transfusion or reoperation; infection which may require antibiotics; injury to bowel, bladder, ureters or other surrounding organs; injury to the fetus; need for additional procedures including hysterectomy in the event of a life-threatening hemorrhage; placental abnormalities wth subsequent pregnancies, incisional problems, thromboembolic phenomenon and other postoperative/anesthesia complications. The patient concurred with the proposed plan, giving informed written consent for the procedure.   Patient has been NPO since yesterday she will remain NPO for procedure. Anesthesia and OR aware. Preoperative prophylactic antibiotics and SCDs ordered on call to the OR.  To OR when ready.

## 2015-04-09 NOTE — Progress Notes (Signed)
Pt complaining of headache spoke with Dr. Trixie Rude, Acetaminophen q8h prn for headache ordered.

## 2015-04-09 NOTE — Op Note (Addendum)
  Cesarean Section Procedure Note  Date of procedure: 04/09/2015   Pre-operative Diagnosis: Intrauterine pregnancy at [redacted]w[redacted]d; failed induction of labor; Cat II fetal heart rate tracing remote from delivery  Post-operative Diagnosis: same, delivered.  Procedure: Low Transverse Cesarean Section through Pfannenstiel incision  Surgeon: Benjaman Kindler, MD, MPH  Assistant(s):  Glenis Smoker, CNM  Anesthesia: Spinal anesthesia  Estimated Blood Loss:  555mL         Drains: none         Total IV Fluids: 863ml  Urine Output: 84ml         Specimens: none         Complications:  None; patient tolerated the procedure well.         Disposition: PACU - hemodynamically stable.         Condition: stable  Findings:  A female infant "Jerilee Field" in cephalic presentation. Amniotic fluid - Clear  Birth weight 3300 g.  Apgars of 9 and 10 at one and five minutes respectively.  Intact placenta with a three-vessel cord.  Grossly normal uterus, tubes and ovaries bilaterally. Filmy intraabdominal adhesions were noted between bladder and fascia  Indications: failed induction and non-reassuring fetal status  Procedure Details  The patient was taken to Operating Room, identified as the correct patient and the procedure verified as C-Section Delivery. A formal Time Out was held with all team members present and in agreement.  After induction of spinal anesthesia, the patient was draped and prepped in the usual sterile manner. A Pfannenstiel skin incision was made and carried down through the subcutaneous tissue to the fascia. Fascial incision was made and extended transversely with the Mayo scissors. The fascia was separated from the underlying rectus tissue superiorly and inferiorly. The peritoneum was identified and entered bluntly. Peritoneal incision was extended longitudinally. The utero-vesical peritoneal reflection was incised transversely and a bladder flap was created digitally.   A low  transverse hysterotomy was made. The fetus was delivered atraumatically. The umbilical cord was clamped x2 and cut and the infant was handed to the awaiting pediatricians. The placenta was removed intact and appeared normal, intact, and with a 3-vessel cord.   The uterus was exteriorized and cleared of all clot and debris. The hysterotomy was closed with running sutures of 0-Vicryl. A second imbricating layer was placed with the same suture. Excellent hemostasis was observed. The peritoneal cavity was cleared of all clots and debris. The uterus was returned to the abdomen.   The pelvis was irrigated and again, excellent hemostasis was noted. The fascia was then reapproximated with running sutures of 0 Vicryl.  The subcutaneous tissue was reapproximated with running sutures of O-vicryl. The skin was reapproximated with staples.  Instrument, sponge, and needle counts were correct prior to the abdominal closure and at the conclusion of the case.   The patient tolerated the procedure well and was transferred to the recovery room in stable condition.   Benjaman Kindler, MD 04/09/2015

## 2015-04-09 NOTE — Anesthesia Preprocedure Evaluation (Addendum)
Anesthesia Evaluation  Patient identified by MRN, date of birth, ID band Patient awake    Reviewed: Allergy & Precautions, NPO status , Patient's Chart, lab work & pertinent test results, reviewed documented beta blocker date and time   Airway Mallampati: III  TM Distance: >3 FB     Dental  (+) Chipped   Pulmonary former smoker,          Cardiovascular     Neuro/Psych    GI/Hepatic   Endo/Other    Renal/GU      Musculoskeletal   Abdominal   Peds  Hematology   Anesthesia Other Findings   Reproductive/Obstetrics                            Anesthesia Physical Anesthesia Plan  ASA: II  Anesthesia Plan: Spinal   Post-op Pain Management:    Induction:   Airway Management Planned: Nasal Cannula  Additional Equipment:   Intra-op Plan:   Post-operative Plan:   Informed Consent: I have reviewed the patients History and Physical, chart, labs and discussed the procedure including the risks, benefits and alternatives for the proposed anesthesia with the patient or authorized representative who has indicated his/her understanding and acceptance.     Plan Discussed with: CRNA  Anesthesia Plan Comments:         Anesthesia Quick Evaluation

## 2015-04-09 NOTE — Anesthesia Procedure Notes (Signed)
Spinal Patient location during procedure: OR Staffing Anesthesiologist: Gunnar Bulla Performed by: anesthesiologist  Preanesthetic Checklist Completed: patient identified, site marked, surgical consent, pre-op evaluation, timeout performed, IV checked and risks and benefits discussed Spinal Block Patient position: sitting Prep: Betadine Patient monitoring: heart rate, cardiac monitor, continuous pulse ox and blood pressure Approach: midline Location: L3-4 Injection technique: single-shot Needle Needle type: Pencil-Tip  Needle gauge: 25 G Needle length: 9 cm Assessment Sensory level: T10 Additional Notes 12.5mg  marcaine and 0.2mg  astromorph.

## 2015-04-09 NOTE — Transfer of Care (Signed)
Immediate Anesthesia Transfer of Care Note  Patient: Caitlin Young  Procedure(s) Performed: Procedure(s): CESAREAN SECTION (N/A)  Patient Location: LDR3  Anesthesia Type:Spinal  Level of Consciousness: awake, alert  and oriented  Airway & Oxygen Therapy: Patient Spontanous Breathing  Post-op Assessment: Report given to RN and Post -op Vital signs reviewed and stable  Post vital signs: stable  Last Vitals:  Filed Vitals:   04/09/15 1410  BP: 111/67  Pulse: 65  Temp: 36.4 C  Resp: 12    Complications: No apparent anesthesia complications

## 2015-04-09 NOTE — Progress Notes (Signed)
OR called to schedule add on C section.

## 2015-04-10 LAB — CBC
HCT: 30.7 % — ABNORMAL LOW (ref 35.0–47.0)
Hemoglobin: 10.5 g/dL — ABNORMAL LOW (ref 12.0–16.0)
MCH: 30.6 pg (ref 26.0–34.0)
MCHC: 34.4 g/dL (ref 32.0–36.0)
MCV: 88.9 fL (ref 80.0–100.0)
Platelets: 249 10*3/uL (ref 150–440)
RBC: 3.45 MIL/uL — AB (ref 3.80–5.20)
RDW: 13.6 % (ref 11.5–14.5)
WBC: 13.2 10*3/uL — AB (ref 3.6–11.0)

## 2015-04-10 NOTE — Progress Notes (Signed)
Subjective: Postpartum Day Day # 1 Cesarean Delivery Patient reports feeling well, voiding well after foley removed and taking po well. Wants IV out. Breastfeeding well.    Objective: Vital signs in last 24 hours: Temp:  [97.4 F (36.3 C)-98.6 F (37 C)] 98.4 F (36.9 C) (07/16 0809) Pulse Rate:  [55-99] 83 (07/16 0809) Resp:  [12-20] 20 (07/16 0809) BP: (93-126)/(54-74) 114/70 mmHg (07/16 0809) SpO2:  [92 %-100 %] 92 % (07/16 0809)  Physical Exam:  General: alert Lochia: appropriate Uterine Fundus: firm Incision: healing well DVT Evaluation: No evidence of DVT seen on physical exam. Heart: S1S2, RRR, no M/R/R. Lungs: CTA bilat, no W/R/R. Abd: Dressing removed with staples intact, Dressing changed around ON-Q pump. +audible BS present., appropriately tender, soft,non-distended  Recent Labs  04/09/15 1235 04/10/15 0720  HGB 11.8* 10.5*  HCT 35.0 30.7*    Assessment/Plan: Status post Cesarean section. Doing well postoperatively. On-Q intact. Will dc IV.   Continue current care.  Glenis Smoker W 04/10/2015, 10:44 AM

## 2015-04-11 MED ORDER — IBUPROFEN 600 MG PO TABS
600.0000 mg | ORAL_TABLET | Freq: Four times a day (QID) | ORAL | Status: DC
  Administered 2015-04-11: 600 mg via ORAL
  Filled 2015-04-11: qty 1

## 2015-04-11 MED ORDER — IBUPROFEN 600 MG PO TABS: 600.0000 mg | ORAL_TABLET | Freq: Four times a day (QID) | ORAL | Status: DC

## 2015-04-12 MED ORDER — DOCUSATE SODIUM 100 MG PO CAPS: 100.0000 mg | ORAL_CAPSULE | Freq: Two times a day (BID) | ORAL | Status: DC

## 2015-04-12 MED ORDER — IBUPROFEN 600 MG PO TABS
600.0000 mg | ORAL_TABLET | Freq: Four times a day (QID) | ORAL | Status: DC
  Administered 2015-04-12 (×2): 600 mg via ORAL
  Filled 2015-04-12 (×2): qty 1

## 2015-04-12 MED ORDER — IBUPROFEN 600 MG PO TABS: 600.0000 mg | ORAL_TABLET | Freq: Four times a day (QID) | ORAL | Status: DC

## 2015-04-12 MED ORDER — MEASLES, MUMPS & RUBELLA VAC ~~LOC~~ INJ
0.5000 mL | INJECTION | Freq: Once | SUBCUTANEOUS | Status: DC
  Filled 2015-04-12: qty 0.5

## 2015-04-12 MED ORDER — OXYCODONE-ACETAMINOPHEN 5-325 MG PO TABS: 1.0000 | ORAL_TABLET | ORAL | Status: DC | PRN

## 2015-04-12 NOTE — Progress Notes (Signed)
Reviewed discharge instructions with patient and her mother, including when to call the doctor.  On Q pump instructions given and removal of pump reviewed with patient and her mother.  Staples removed.  Prescriptions given.

## 2015-04-12 NOTE — Discharge Summary (Signed)
Obstetric Discharge Summary Reason for Admission: induction of labor Prenatal Procedures: none Intrapartum Procedures: cesarean: low cervical, transverse Postpartum Procedures: none Complications-Operative and Postpartum: none HEMOGLOBIN  Date Value Ref Range Status  04/10/2015 10.5* 12.0 - 16.0 g/dL Final   HGB  Date Value Ref Range Status  09/22/2012 12.9 12.0-16.0 g/dL Final   HCT  Date Value Ref Range Status  04/10/2015 30.7* 35.0 - 47.0 % Final  09/22/2012 38.0 35.0-47.0 % Final    Physical Exam:  General: alert and cooperative Lochia: appropriate CV RRR  Lungs CTA  Uterine Fundus: firm Incision: healing well DVT Evaluation: No evidence of DVT seen on physical exam.  Discharge Diagnoses: Term Pregnancy-delivered, failed induction   Discharge Information: Date: 04/12/2015 Activity: pelvic rest Diet: routine Medications: Ibuprofen and Percocet Condition: stable Instructions: refer to practice specific booklet Discharge to: home Follow-up Information    Follow up with Benjaman Kindler, MD In 2 weeks.   Specialty:  Obstetrics and Gynecology   Why:  For wound re-check   Contact information:   [place] [id number] [phone]       Newborn Data: Live born female  Birth Weight: 7 lb 4.4 oz (3300 g) APGAR: 9, 10  Home with mother.  SCHERMERHORN,THOMAS 04/12/2015, 9:58 AM

## 2015-04-20 NOTE — Anesthesia Postprocedure Evaluation (Signed)
  Anesthesia Post-op Note  Patient: Caitlin Young  Procedure(s) Performed: Procedure(s): CESAREAN SECTION (N/A)  Anesthesia type:Spinal  Patient location: PACU  Post pain: Pain level controlled  Post assessment: Post-op Vital signs reviewed, Patient's Cardiovascular Status Stable, Respiratory Function Stable, Patent Airway and No signs of Nausea or vomiting  Post vital signs: Reviewed and stable  Last Vitals:  Filed Vitals:   04/12/15 1134  BP:   Pulse:   Temp: 37.1 C  Resp:     Level of consciousness: awake, alert  and patient cooperative  Complications: No apparent anesthesia complications

## 2015-12-31 ENCOUNTER — Ambulatory Visit
Admission: EM | Admit: 2015-12-31 | Discharge: 2015-12-31 | Disposition: A | Discharge status: Home / Self Care | Payer: Medicaid Other | Attending: Family Medicine | Admitting: Family Medicine

## 2015-12-31 ENCOUNTER — Encounter: Payer: Self-pay | Admitting: *Deleted

## 2015-12-31 DIAGNOSIS — H00033 Abscess of eyelid right eye, unspecified eyelid: Secondary | ICD-10-CM

## 2015-12-31 MED ORDER — CLINDAMYCIN HCL 300 MG PO CAPS: 300.0000 mg | ORAL_CAPSULE | Freq: Three times a day (TID) | ORAL | Status: DC

## 2015-12-31 NOTE — ED Provider Notes (Signed)
CSN: [id number]     Arrival date & time 12/31/15  1847 History   First MD Initiated Contact with Patient 12/31/15 1951     Chief Complaint  Patient presents with  . Eye Drainage  . Eye Pain  . Facial Swelling   (Consider location/radiation/quality/duration/timing/severity/associated sxs/prior Treatment) HPI Comments: 25 yo female with a 4-5 days h/o progressively worsening right eyelid swelling, redness and pain. States seemed to have started as a stye but worsened. Patient has been trying warm compresses. Denies any fevers, chills.   The history is provided by the patient.    Past Medical History  Diagnosis Date  . Vaginal Pap smear, abnormal    Past Surgical History  Procedure Laterality Date  . Cesarean section N/A 04/09/2015    Procedure: CESAREAN SECTION;  Surgeon: Benjaman Kindler, MD;  Location: [facility] ORS;  Service: Obstetrics;  Laterality: N/A;   Family History  Problem Relation Age of Onset  . Diabetes Mother   . Hypertension Mother   . Diabetes Maternal Grandmother   . Cancer Maternal Grandmother   . Hypertension Maternal Grandmother    Social History  Substance Use Topics  . Smoking status: Former Research scientist (life sciences)  . Smokeless tobacco: None  . Alcohol Use: No     Comment: pt had pos drug screen at [facility] but did not disclose at [facility] new OB visit.    OB History    Gravida Para Term Preterm AB TAB SAB Ectopic Multiple Living   2 1 1  0 1 0 1 0 0 1     Review of Systems  Allergies  Review of patient's allergies indicates no known allergies.  Home Medications   Prior to Admission medications   Medication Sig Start Date End Date Taking? Authorizing Provider  cetirizine (ZYRTEC) 10 MG tablet Take 10 mg by mouth daily.   Yes Historical Provider, MD  clindamycin (CLEOCIN) 300 MG capsule Take 1 capsule (300 mg total) by mouth 3 (three) times daily. 12/31/15   Norval Gable, MD  docusate sodium (COLACE) 100 MG capsule Take 1 capsule (100 mg total) by mouth 2 (two) times daily.  04/12/15   Gwen Her Schermerhorn, MD  ibuprofen (ADVIL,MOTRIN) 600 MG tablet Take 1 tablet (600 mg total) by mouth 4 (four) times daily. 04/12/15   Gwen Her Schermerhorn, MD  oxyCODONE-acetaminophen (PERCOCET/ROXICET) 5-325 MG per tablet Take 1 tablet by mouth every 4 (four) hours as needed (for pain scale 4-7). 04/12/15   Boykin Nearing, MD   Meds Ordered and Administered this Visit  Medications - No data to display  BP 126/77 mmHg  Pulse 92  Temp(Src) 98.3 F (36.8 C) (Oral)  Resp 16  Ht 5\' 6"  (1.676 m)  Wt 215 lb (97.523 kg)  BMI 34.72 kg/m2  SpO2 100%  LMP 12/08/2015 (Exact Date) No data found.   Physical Exam  Constitutional: She appears well-developed and well-nourished. No distress.  Eyes: Conjunctivae and EOM are normal. Pupils are equal, round, and reactive to light. Right eye exhibits no discharge. Left eye exhibits no discharge.  Right upper eyelid erythema, edema and tenderness to palpation  Skin: She is not diaphoretic.  Nursing note and vitals reviewed.   ED Course  Procedures (including critical care time)  Labs Review Labs Reviewed - No data to display  Imaging Review No results found.   Visual Acuity Review  Right Eye Distance:   Left Eye Distance:   Bilateral Distance:    Right Eye Near:   Left Eye Near:  Bilateral Near:         MDM   1. Cellulitis of right eyelid    Discharge Medication List as of 12/31/2015  8:11 PM    START taking these medications   Details  clindamycin (CLEOCIN) 300 MG capsule Take 1 capsule (300 mg total) by mouth 3 (three) times daily., Starting 12/31/2015, Until Discontinued, Normal       1. diagnosis reviewed with patient 2. rx as per orders above; reviewed possible side effects, interactions, risks and benefits  3. Recommend supportive treatment with warm compresses to area 4. Follow-up prn if symptoms worsen or don't improve    Norval Gable, MD 12/31/15 2051

## 2015-12-31 NOTE — ED Notes (Signed)
Right eye edematous and painful, yellow drainage from right eye. Pt awoke today with right eye matted closed.

## 2016-07-13 LAB — HM PAP SMEAR: HM Pap smear: NEGATIVE

## 2017-04-16 ENCOUNTER — Encounter (HOSPITAL_COMMUNITY): Payer: Self-pay | Admitting: Emergency Medicine

## 2017-04-16 ENCOUNTER — Emergency Department (HOSPITAL_COMMUNITY)
Admission: EM | Admit: 2017-04-16 | Discharge: 2017-04-16 | Disposition: A | Discharge status: Home / Self Care | Payer: Medicaid Other | Attending: Emergency Medicine | Admitting: Emergency Medicine

## 2017-04-16 DIAGNOSIS — Z5321 Procedure and treatment not carried out due to patient leaving prior to being seen by health care provider: Secondary | ICD-10-CM | POA: Insufficient documentation

## 2017-04-16 DIAGNOSIS — R109 Unspecified abdominal pain: Secondary | ICD-10-CM | POA: Diagnosis present

## 2017-04-16 HISTORY — DX: Encounter for supervision of normal pregnancy, unspecified, unspecified trimester: Z34.90

## 2017-04-16 LAB — HCG, QUANTITATIVE, PREGNANCY: hCG, Beta Chain, Quant, S: 59309 m[IU]/mL — ABNORMAL HIGH (ref <5)

## 2017-04-16 NOTE — ED Notes (Signed)
Pt states she is leaving at this time, attempted to encourage staying to see a doc.

## 2017-04-16 NOTE — ED Triage Notes (Signed)
Pt. Stated, I started to have some bleeding today like I was getting ready to start my period. Im not right now.  Im also having some cramping with nausea. I've been Nausea and sick since I was pregnant, which is 7 weeks.  I [facility] to verify.

## 2017-04-17 ENCOUNTER — Inpatient Hospital Stay (HOSPITAL_COMMUNITY): Payer: Medicaid Other

## 2017-04-17 ENCOUNTER — Encounter (HOSPITAL_COMMUNITY): Payer: Self-pay | Admitting: *Deleted

## 2017-04-17 ENCOUNTER — Inpatient Hospital Stay (HOSPITAL_COMMUNITY)
Admission: AD | Admit: 2017-04-17 | Discharge: 2017-04-17 | Disposition: A | Discharge status: Home / Self Care | Payer: Medicaid Other | Source: Ambulatory Visit | Attending: Family Medicine | Admitting: Family Medicine

## 2017-04-17 DIAGNOSIS — O4691 Antepartum hemorrhage, unspecified, first trimester: Secondary | ICD-10-CM | POA: Insufficient documentation

## 2017-04-17 DIAGNOSIS — R109 Unspecified abdominal pain: Secondary | ICD-10-CM | POA: Diagnosis not present

## 2017-04-17 DIAGNOSIS — Z87891 Personal history of nicotine dependence: Secondary | ICD-10-CM | POA: Diagnosis not present

## 2017-04-17 DIAGNOSIS — O210 Mild hyperemesis gravidarum: Secondary | ICD-10-CM | POA: Insufficient documentation

## 2017-04-17 DIAGNOSIS — O209 Hemorrhage in early pregnancy, unspecified: Secondary | ICD-10-CM

## 2017-04-17 DIAGNOSIS — O418X10 Other specified disorders of amniotic fluid and membranes, first trimester, not applicable or unspecified: Secondary | ICD-10-CM

## 2017-04-17 DIAGNOSIS — O26891 Other specified pregnancy related conditions, first trimester: Secondary | ICD-10-CM

## 2017-04-17 DIAGNOSIS — O219 Vomiting of pregnancy, unspecified: Secondary | ICD-10-CM

## 2017-04-17 DIAGNOSIS — Z3A01 Less than 8 weeks gestation of pregnancy: Secondary | ICD-10-CM | POA: Insufficient documentation

## 2017-04-17 DIAGNOSIS — O468X1 Other antepartum hemorrhage, first trimester: Secondary | ICD-10-CM

## 2017-04-17 HISTORY — DX: Malignant neoplasm of cervix uteri, unspecified: C53.9

## 2017-04-17 HISTORY — DX: Headache: R51

## 2017-04-17 HISTORY — DX: Headache, unspecified: R51.9

## 2017-04-17 HISTORY — DX: Anemia, unspecified: D64.9

## 2017-04-17 LAB — URINALYSIS, ROUTINE W REFLEX MICROSCOPIC
BILIRUBIN URINE: NEGATIVE
GLUCOSE, UA: NEGATIVE mg/dL
HGB URINE DIPSTICK: NEGATIVE
KETONES UR: 20 mg/dL — AB
LEUKOCYTES UA: NEGATIVE
NITRITE: NEGATIVE
Protein, ur: 30 mg/dL — AB
Specific Gravity, Urine: 1.033 — ABNORMAL HIGH (ref 1.005–1.030)
pH: 5 (ref 5.0–8.0)

## 2017-04-17 LAB — CBC
HCT: 37.6 % (ref 36.0–46.0)
Hemoglobin: 13.3 g/dL (ref 12.0–15.0)
MCH: 31.1 pg (ref 26.0–34.0)
MCHC: 35.4 g/dL (ref 30.0–36.0)
MCV: 88.1 fL (ref 78.0–100.0)
PLATELETS: 348 10*3/uL (ref 150–400)
RBC: 4.27 MIL/uL (ref 3.87–5.11)
RDW: 12.6 % (ref 11.5–15.5)
WBC: 7.3 10*3/uL (ref 4.0–10.5)

## 2017-04-17 LAB — WET PREP, GENITAL
Clue Cells Wet Prep HPF POC: NONE SEEN
Sperm: NONE SEEN
Trich, Wet Prep: NONE SEEN
YEAST WET PREP: NONE SEEN

## 2017-04-17 MED ORDER — METOCLOPRAMIDE HCL 10 MG PO TABS: 10.0000 mg | ORAL_TABLET | Freq: Three times a day (TID) | ORAL | 0 refills | Status: DC

## 2017-04-17 NOTE — Discharge Instructions (Signed)
Subchorionic Hematoma °A subchorionic hematoma is a gathering of blood between the outer wall of the placenta and the inner wall of the womb (uterus). The placenta is the organ that connects the fetus to the wall of the uterus. The placenta performs the feeding, breathing (oxygen to the fetus), and waste removal (excretory work) of the fetus. °Subchorionic hematoma is the most common abnormality found on a result from ultrasonography done during the first trimester or early second trimester of pregnancy. If there has been little or no vaginal bleeding, early small hematomas usually shrink on their own and do not affect your baby or pregnancy. The blood is gradually absorbed over 1-2 weeks. When bleeding starts later in pregnancy or the hematoma is larger or occurs in an older pregnant woman, the outcome may not be as good. Larger hematomas may get bigger, which increases the chances for miscarriage. Subchorionic hematoma also increases the risk of premature detachment of the placenta from the uterus, preterm (premature) labor, and stillbirth. °Follow these instructions at home: °· Stay on bed rest if your health care provider recommends this. Although bed rest will not prevent more bleeding or prevent a miscarriage, your health care provider may recommend bed rest until you are advised otherwise. °· Avoid heavy lifting (more than 10 lb [4.5 kg]), exercise, sexual intercourse, or douching as directed by your health care provider. °· Keep track of the number of pads you use each day and how soaked (saturated) they are. Write down this information. °· Do not use tampons. °· Keep all follow-up appointments as directed by your health care provider. Your health care provider may ask you to have follow-up blood tests or ultrasound tests or both. °Get help right away if: °· You have severe cramps in your stomach, back, abdomen, or pelvis. °· You have a fever. °· You pass large clots or tissue. Save any tissue for your  health care provider to look at. °· Your bleeding increases or you become lightheaded, feel weak, or have fainting episodes. °This information is not intended to replace advice given to you by your health care provider. Make sure you discuss any questions you have with your health care provider. °Document Released: 12/27/2006 Document Revised: 02/17/2016 Document Reviewed: 04/10/2013 °Elsevier Interactive Patient Education © 2017 [employer] ° °

## 2017-04-17 NOTE — Progress Notes (Cosign Needed)
History     CSN: [id number]  Arrival date and time: 04/17/17 1207   First Provider Initiated Contact with Patient 04/17/17 1333      Chief Complaint  Patient presents with  . Vaginal Bleeding  . Abdominal Pain  . Emesis   Patient is a 26 y/o G3P1011 [redacted] weeks pregnant female who presents to the MAU today c/o gradual onset of persistent vaginal bleeding since yesterday. She reports that yesterday she had some blood upon wiping coming from her vagina, described as "like the start of her period". Today however, the discharge is more brown in color. Last Wednesday, the patient was seen at the health department and had confirmed pregnancy. She has not yet received any prenatal care and has not yet had an [place] to confirm location of pregnancy. Associated lower abd cramping. She has been experiencing intermittent nausea and vomiting since last Thursday, with last episode of vomiting being approximately one hour ago. Denies blood in vomit. Associated intermittent diarrhea/loose stools since last week as well. She currently has a HA, described as similar to past HA. Denies fever, chills, heamturia, dysuria, urinary frequency, nor leg swelling.    OB History    Gravida Para Term Preterm AB Living   3 1 1  0 1 1   SAB TAB Ectopic Multiple Live Births   1 0 0 0 1      Past Medical History:  Diagnosis Date  . Anemia   . Cervical cancer ([place])   . Headache   . Pregnant   . Vaginal Pap smear, abnormal     Past Surgical History:  Procedure Laterality Date  . CESAREAN SECTION N/A 04/09/2015   Procedure: CESAREAN SECTION;  Surgeon: Benjaman Kindler, MD;  Location: [facility] ORS;  Service: Obstetrics;  Laterality: N/A;  . LEEP      Family History  Problem Relation Age of Onset  . Diabetes Mother   . Hypertension Mother   . Diabetes Maternal Grandmother   . Cancer Maternal Grandmother        breast  . Hypertension Maternal Grandmother   . Asthma Maternal Grandmother     Social History   Substance Use Topics  . Smoking status: Former Smoker    Types: Cigarettes  . Smokeless tobacco: Never Used     Comment: quit with +preg test  . Alcohol use No     Comment: pt had pos drug screen at [facility] but did not disclose at [facility] new OB visit.     Allergies: No Known Allergies  Prescriptions Prior to Admission  Medication Sig Dispense Refill Last Dose  . cetirizine (ZYRTEC) 10 MG tablet Take 10 mg by mouth daily.   12/31/2015 at Unknown time  . clindamycin (CLEOCIN) 300 MG capsule Take 1 capsule (300 mg total) by mouth 3 (three) times daily. 21 capsule 0   . docusate sodium (COLACE) 100 MG capsule Take 1 capsule (100 mg total) by mouth 2 (two) times daily. 30 capsule 0   . ibuprofen (ADVIL,MOTRIN) 600 MG tablet Take 1 tablet (600 mg total) by mouth 4 (four) times daily. 60 tablet 0   . oxyCODONE-acetaminophen (PERCOCET/ROXICET) 5-325 MG per tablet Take 1 tablet by mouth every 4 (four) hours as needed (for pain scale 4-7). 30 tablet 0     Review of Systems  Constitutional: Negative for chills and fever.  Respiratory: Negative for shortness of breath.   Cardiovascular: Negative for chest pain.  Gastrointestinal: Positive for abdominal pain (cramping), diarrhea, nausea and vomiting. Negative  for blood in stool and constipation.  Genitourinary: Positive for vaginal bleeding. Negative for difficulty urinating, dysuria, frequency, hematuria and urgency.   Physical Exam   Blood pressure 123/70, pulse (!) 101, temperature 98.3 F (36.8 C), temperature source Oral, resp. rate 16, height 5\' 6"  (1.676 m), weight 173 lb 8 oz (78.7 kg), last menstrual period 02/26/2017, SpO2 100 %, unknown if currently breastfeeding.  Physical Exam  Constitutional: She is oriented to person, place, and time. She appears well-developed and well-nourished. No distress.  HENT:  Head: Normocephalic and atraumatic.  GI: Soft. There is no tenderness. There is no rebound and no guarding.  Genitourinary: Rectal exam  shows no external hemorrhoid. Pelvic exam was performed with patient supine. There is no rash, tenderness or lesion on the right labia. There is no rash, tenderness or lesion on the left labia. Cervix exhibits motion tenderness (mild). Right adnexum displays no mass and no tenderness. Left adnexum displays no mass and no tenderness. No erythema or bleeding in the vagina. No foreign body in the vagina. No signs of injury around the vagina. No vaginal discharge found.  Musculoskeletal: She exhibits no edema.  Neurological: She is alert and oriented to person, place, and time.  Skin: Skin is warm and dry. No rash noted. No erythema.  Psychiatric: She has a normal mood and affect. Her behavior is normal.    MAU Course  Procedures  MDM   Assessment and Plan   Patient is a 26 y/o female who presents with new onset vaginal bleeding, lower abd cramping, and N/V/D. Upon PE, her abd was NTTP. Pelvic exam with mild CMT. Urine evidenced patient's current dehydration. Administered IM Phenergan in MAU and fluids for dehydration.  Eli Hose 04/17/2017, 1:39 PM

## 2017-04-17 NOTE — MAU Note (Signed)
Is 7 wks preg, went to HD last wed, preg confirmed.  Since [place], has not been able to keep anything down, having loose stools. Having some abd pain, cramping. Saw blood yesterday, brown today.

## 2017-04-17 NOTE — MAU Provider Note (Signed)
History     CSN: [id number]  Arrival date and time: 04/17/17 1207   First Provider Initiated Contact with Patient 04/17/17 1333      Chief Complaint  Patient presents with  . Vaginal Bleeding  . Abdominal Pain  . Emesis   Patient is a 26 y/o G3P1011 @ [redacted]w[redacted]d weeks pregnant female who presents to the MAU today c/o gradual onset of persistent vaginal bleeding since yesterday. She reports that yesterday she had some blood upon wiping coming from her vagina, described as "like the start of her period". Today however, the discharge is more brown in color. Last Wednesday, the patient was seen at the health department and had confirmed pregnancy. She has not yet received any prenatal care and has not yet had an [place] to confirm location of pregnancy. Associated lower abd cramping. She has been experiencing intermittent nausea and vomiting since last Thursday, with last episode of vomiting being approximately one hour ago. Denies blood in vomit. Associated intermittent diarrhea/loose stools since last week as well. She currently has a HA, described as similar to past HA. Denies fever, chills, heamturia, dysuria, urinary frequency, nor leg swelling. Went to ED yesterday and had blood work drawn, however decided not to stay.     OB History    Gravida Para Term Preterm AB Living   3 1 1  0 1 1   SAB TAB Ectopic Multiple Live Births   1 0 0 0 1      Past Medical History:  Diagnosis Date  . Anemia   . Cervical cancer ([place])   . Headache   . Pregnant   . Vaginal Pap smear, abnormal     Past Surgical History:  Procedure Laterality Date  . CESAREAN SECTION N/A 04/09/2015   Procedure: CESAREAN SECTION;  Surgeon: Benjaman Kindler, MD;  Location: [facility] ORS;  Service: Obstetrics;  Laterality: N/A;  . LEEP      Family History  Problem Relation Age of Onset  . Diabetes Mother   . Hypertension Mother   . Diabetes Maternal Grandmother   . Cancer Maternal Grandmother        breast  . Hypertension  Maternal Grandmother   . Asthma Maternal Grandmother     Social History  Substance Use Topics  . Smoking status: Former Smoker    Types: Cigarettes  . Smokeless tobacco: Never Used     Comment: quit with +preg test  . Alcohol use No     Comment: pt had pos drug screen at [facility] but did not disclose at [facility] new OB visit.     Allergies: No Known Allergies  Prescriptions Prior to Admission  Medication Sig Dispense Refill Last Dose  . cetirizine (ZYRTEC) 10 MG tablet Take 10 mg by mouth daily.   12/31/2015 at Unknown time  . clindamycin (CLEOCIN) 300 MG capsule Take 1 capsule (300 mg total) by mouth 3 (three) times daily. 21 capsule 0   . docusate sodium (COLACE) 100 MG capsule Take 1 capsule (100 mg total) by mouth 2 (two) times daily. 30 capsule 0   . ibuprofen (ADVIL,MOTRIN) 600 MG tablet Take 1 tablet (600 mg total) by mouth 4 (four) times daily. 60 tablet 0   . oxyCODONE-acetaminophen (PERCOCET/ROXICET) 5-325 MG per tablet Take 1 tablet by mouth every 4 (four) hours as needed (for pain scale 4-7). 30 tablet 0    Results for orders placed or performed during the hospital encounter of 04/17/17 (from the past 48 hour(s))  Urinalysis, Routine w  reflex microscopic     Status: Abnormal   Collection Time: 04/17/17 12:28 PM  Result Value Ref Range   Color, Urine AMBER (A) YELLOW    Comment: BIOCHEMICALS MAY BE AFFECTED BY COLOR   APPearance CLOUDY (A) CLEAR   Specific Gravity, Urine 1.033 (H) 1.005 - 1.030   pH 5.0 5.0 - 8.0   Glucose, UA NEGATIVE NEGATIVE mg/dL   Hgb urine dipstick NEGATIVE NEGATIVE   Bilirubin Urine NEGATIVE NEGATIVE   Ketones, ur 20 (A) NEGATIVE mg/dL   Protein, ur 30 (A) NEGATIVE mg/dL   Nitrite NEGATIVE NEGATIVE   Leukocytes, UA NEGATIVE NEGATIVE   RBC / HPF 0-5 0 - 5 RBC/hpf   WBC, UA 0-5 0 - 5 WBC/hpf   Bacteria, UA RARE (A) NONE SEEN   Squamous Epithelial / LPF 6-30 (A) NONE SEEN   Mucous PRESENT   CBC     Status: None   Collection Time: 04/17/17  2:05 PM   Result Value Ref Range   WBC 7.3 4.0 - 10.5 K/uL   RBC 4.27 3.87 - 5.11 MIL/uL   Hemoglobin 13.3 12.0 - 15.0 g/dL   HCT 37.6 36.0 - 46.0 %   MCV 88.1 78.0 - 100.0 fL   MCH 31.1 26.0 - 34.0 pg   MCHC 35.4 30.0 - 36.0 g/dL   RDW 12.6 11.5 - 15.5 %   Platelets 348 150 - 400 K/uL  Wet prep, genital     Status: Abnormal   Collection Time: 04/17/17  2:15 PM  Result Value Ref Range   Yeast Wet Prep HPF POC NONE SEEN NONE SEEN   Trich, Wet Prep NONE SEEN NONE SEEN   Clue Cells Wet Prep HPF POC NONE SEEN NONE SEEN   WBC, Wet Prep HPF POC FEW (A) NONE SEEN    Comment: MANY BACTERIA SEEN   Sperm NONE SEEN     Review of Systems  Constitutional: Negative for chills and fever.  Respiratory: Negative for shortness of breath.   Cardiovascular: Negative for chest pain.  Gastrointestinal: Positive for abdominal pain (cramping), diarrhea, nausea and vomiting. Negative for blood in stool and constipation.  Genitourinary: Positive for vaginal bleeding. Negative for difficulty urinating, dysuria, frequency, hematuria and urgency.   Physical Exam   Blood pressure 123/70, pulse (!) 101, temperature 98.3 F (36.8 C), temperature source Oral, resp. rate 16, height 5\' 6"  (1.676 m), weight 173 lb 8 oz (78.7 kg), last menstrual period 02/26/2017, SpO2 100 %, unknown if currently breastfeeding.  Physical Exam  Constitutional: She is oriented to person, place, and time. She appears well-developed and well-nourished. No distress.  HENT:  Head: Normocephalic and atraumatic.  GI: Soft. There is no tenderness. There is no rebound and no guarding.  Genitourinary: Rectal exam shows no external hemorrhoid. Pelvic exam was performed with patient supine. There is no rash, tenderness or lesion on the right labia. There is no rash, tenderness or lesion on the left labia. Cervix exhibits motion tenderness (mild). Right adnexum displays no mass and no tenderness. Left adnexum displays no mass and no tenderness. No  erythema or bleeding in the vagina. No foreign body in the vagina. No signs of injury around the vagina. No vaginal discharge found.  Musculoskeletal: She exhibits no edema.  Neurological: She is alert and oriented to person, place, and time.  Skin: Skin is warm and dry. No rash noted. No erythema.  Psychiatric: She has a normal mood and affect. Her behavior is normal.    MAU Course  Procedures  MDM  Wet prep & GC HIV, CBC, ABO: O positive blood type  US OB transvaginal   Assessment and Plan  Patient is a 26 y/o female who presents with new onset vaginal bleeding, lower abd cramping, and N/V/D. Upon PE, her abd was NTTP. Pelvic exam with mild CMT. No blood noted on exam Urine evidenced patient's current dehydration however mild. No active vomiting in MAU.   Eli Hose 04/17/2017, 1:39 PM    A:  1. Nausea and vomiting in pregnancy   2. Vaginal bleeding in pregnancy, first trimester   3. Abdominal pain during pregnancy in first trimester   4. Subchorionic hematoma in first trimester, single or unspecified fetus    P:  Discharge home in stable condition Rx: Reglan Start prenatal care Prenatal vitamins daily Increase PO fluid intake   I confirm that I have verified the information documented in the student physician assistant's note and that I have also personally reperformed the physical exam and all medical decision making activities.   Lezlie Lye, NP 04/17/2017 3:34 PM

## 2017-04-18 LAB — GC/CHLAMYDIA PROBE AMP (~~LOC~~) NOT AT ARMC
Chlamydia: NEGATIVE
Neisseria Gonorrhea: NEGATIVE

## 2017-04-18 LAB — HIV ANTIBODY (ROUTINE TESTING W REFLEX): HIV Screen 4th Generation wRfx: NONREACTIVE

## 2017-05-11 ENCOUNTER — Other Ambulatory Visit: Payer: Self-pay | Admitting: Obstetrics and Gynecology

## 2017-05-11 DIAGNOSIS — Z369 Encounter for antenatal screening, unspecified: Secondary | ICD-10-CM

## 2017-05-21 ENCOUNTER — Other Ambulatory Visit: Payer: Self-pay | Admitting: Obstetrics & Gynecology

## 2017-05-21 ENCOUNTER — Ambulatory Visit
Admission: RE | Admit: 2017-05-21 | Discharge: 2017-05-21 | Disposition: A | Discharge status: Home / Self Care | Payer: Medicaid Other | Source: Ambulatory Visit | Attending: Obstetrics and Gynecology | Admitting: Obstetrics and Gynecology

## 2017-05-21 ENCOUNTER — Ambulatory Visit (HOSPITAL_BASED_OUTPATIENT_CLINIC_OR_DEPARTMENT_OTHER)
Admission: RE | Admit: 2017-05-21 | Discharge: 2017-05-21 | Disposition: A | Discharge status: Home / Self Care | Payer: Medicaid Other | Source: Ambulatory Visit | Attending: Obstetrics & Gynecology | Admitting: Obstetrics & Gynecology

## 2017-05-21 ENCOUNTER — Other Ambulatory Visit: Payer: Self-pay | Admitting: *Deleted

## 2017-05-21 VITALS — BP 121/62 | HR 93 | Temp 97.7°F | Resp 16 | Ht 67.0 in | Wt 189.0 lb

## 2017-05-21 DIAGNOSIS — F1210 Cannabis abuse, uncomplicated: Secondary | ICD-10-CM | POA: Diagnosis not present

## 2017-05-21 DIAGNOSIS — O99321 Drug use complicating pregnancy, first trimester: Secondary | ICD-10-CM | POA: Diagnosis not present

## 2017-05-21 DIAGNOSIS — Z3A12 12 weeks gestation of pregnancy: Secondary | ICD-10-CM | POA: Insufficient documentation

## 2017-05-21 DIAGNOSIS — Z369 Encounter for antenatal screening, unspecified: Secondary | ICD-10-CM

## 2017-05-21 DIAGNOSIS — Z3481 Encounter for supervision of other normal pregnancy, first trimester: Secondary | ICD-10-CM | POA: Diagnosis present

## 2017-05-21 NOTE — Progress Notes (Signed)
Referring physician:  [facility] Length of Consultation: 40 minutes   Caitlin Young  was referred to [place] for genetic counseling to review prenatal screening and testing options.  This note summarizes the information we discussed.    We offered the following routine screening tests for this pregnancy:  First trimester screening, which includes nuchal translucency ultrasound screen and first trimester maternal serum marker screening.  The nuchal translucency has approximately an 80% detection rate for Down syndrome and can be positive for other chromosome abnormalities as well as congenital heart defects.  When combined with a maternal serum marker screening, the detection rate is up to 90% for Down syndrome and up to 97% for trisomy 18.     Maternal serum marker screening, a blood test that measures pregnancy proteins, can provide risk assessments for Down syndrome, trisomy 18, and open neural tube defects (spina bifida, anencephaly). Because it does not directly examine the fetus, it cannot positively diagnose or rule out these problems.  Targeted ultrasound uses high frequency sound waves to create an image of the developing fetus.  An ultrasound is often recommended as a routine means of evaluating the pregnancy.  It is also used to screen for fetal anatomy problems (for example, a heart defect) that might be suggestive of a chromosomal or other abnormality.   Should these screening tests indicate an increased concern, then the following additional testing options would be offered:  The chorionic villus sampling procedure is available for first trimester chromosome analysis.  This involves the withdrawal of a small amount of chorionic villi (tissue from the developing placenta).  Risk of pregnancy loss is estimated to be approximately 1 in 200 to 1 in 100 (0.5 to 1%).  There is approximately a 1% (1 in 100) chance that the CVS chromosome results will  be unclear.  Chorionic villi cannot be tested for neural tube defects.     Amniocentesis involves the removal of a small amount of amniotic fluid from the sac surrounding the fetus with the use of a thin needle inserted through the maternal abdomen and uterus.  Ultrasound guidance is used throughout the procedure.  Fetal cells from amniotic fluid are directly evaluated and > 99.5% of chromosome problems and > 98% of open neural tube defects can be detected. This procedure is generally performed after the 15th week of pregnancy.  The main risks to this procedure include complications leading to miscarriage in less than 1 in 200 cases (0.5%).  As another option for information if the pregnancy is suspected to be an an increased chance for certain chromosome conditions, we also reviewed the availability of cell free fetal DNA testing from maternal blood to determine whether or not the baby may have either Down syndrome, trisomy 36, or trisomy 19.  This test utilizes a maternal blood sample and DNA sequencing technology to isolate circulating cell free fetal DNA from maternal plasma.  The fetal DNA can then be analyzed for DNA sequences that are derived from the three most common chromosomes involved in aneuploidy, chromosomes 13, 18, and 21.  If the overall amount of DNA is greater than the expected level for any of these chromosomes, aneuploidy is suspected.  While we do not consider it a replacement for invasive testing and karyotype analysis, a negative result from this testing would be reassuring, though not a guarantee of a normal chromosome complement for the baby.  An abnormal result is certainly suggestive of an abnormal chromosome complement,  though we would still recommend CVS or amniocentesis to confirm any findings from this testing.  Cystic Fibrosis and Spinal Muscular Atrophy (SMA) screening were also discussed with the patient. Both conditions are recessive, which means that both parents must be  carriers in order to have a child with the disease.  Cystic fibrosis (CF) is one of the most common genetic conditions in persons of Caucasian ancestry.  This condition occurs in approximately 1 in 2,500 Caucasian persons and results in thickened secretions in the lungs, digestive, and reproductive systems.  For a baby to be at risk for having CF, both of the parents must be carriers for this condition.  Approximately 1 in 63 Caucasian persons is a carrier for CF.  Current carrier testing looks for the most common mutations in the gene for CF and can detect approximately 90% of carriers in the Caucasian population.  This means that the carrier screening can greatly reduce, but cannot eliminate, the chance for an individual to have a child with CF.  If an individual is found to be a carrier for CF, then carrier testing would be available for the partner. As part of [place] newborn screening profile, all babies born in the state of [place] will have a two-tier screening process.  Specimens are first tested to determine the concentration of immunoreactive trypsinogen (IRT).  The top 5% of specimens with the highest IRT values then undergo DNA testing using a panel of over 40 common CF mutations. SMA is a neurodegenerative disorder that leads to atrophy of skeletal muscle and overall weakness.  This condition is also more prevalent in the Caucasian population, with 1 in 40-1 in 60 persons being a carrier and 1 in 6,000-1 in 10,000 children being affected.  There are multiple forms of the disease, with some causing death in infancy to other forms with survival into adulthood.  The genetics of SMA is complex, but carrier screening can detect up to 95% of carriers in the Caucasian population.  Similar to CF, a negative result can greatly reduce, but cannot eliminate, the chance to have a child with SMA.  We also reviewed testing for hemoglobinopathies, as the patient and her partner are of African American  ancestry.  She had prior screening with hemoglobin fractionation that showed normal adult hemoglobin, so she is not expected to be a carrier for sickle cell or other hemoglobinopathy.  We obtained a detailed family history and pregnancy history.  The family history was reported to be unremarkable for birth defects, intellectual delays, recurrent pregnancy loss or known chromosome abnormalities.  Ms. Mcbain reported that this is the second pregnancy for she and her partner.  She had one very early miscarriage about 10 years ago.  She reported no complications or exposures in this pregnancy other than marijuana use early in the first trimester.The use of marijuana in pregnancy is known to be associated with low birth weight and premature delivery.  We therefore suggested the patient avoid smoking marijuana during this time.  After consideration of the options, Ms. Philbin elected to proceed with first trimester screening as well as CF and SMA carrier screening.  An ultrasound was performed at the time of the visit.  The gestational age was consistent with prior dating.  Fetal anatomy could not be assessed due to early gestational age.  Please refer to the ultrasound report for details of that study.  Ms. Guadarrama was encouraged to call with questions or concerns.  We can be contacted at [phone].    Wilburt Finlay, MS, CGC

## 2017-05-24 ENCOUNTER — Telehealth: Payer: Self-pay | Admitting: Obstetrics and Gynecology

## 2017-05-24 NOTE — Telephone Encounter (Signed)
  Caitlin Young elected to undergo First Trimester screening on 05/21/2017.  To review, first trimester screening, includes nuchal translucency ultrasound screen and/or first trimester maternal serum marker screening.  The nuchal translucency has approximately an 80% detection rate for Down syndrome and can be positive for other chromosome abnormalities as well as heart defects.  When combined with a maternal serum marker screening, the detection rate is up to 90% for Down syndrome and up to 97% for trisomy 13 and 18.     The results of the First Trimester Nuchal Translucency and Biochemical Screening were within normal range.  The risk for Down syndrome is now estimated to be less than 1 in 10,000.  The risk for Trisomy 13/18 is also estimated to be less than 1 in 10,000.  Should more definitive information be desired, we would offer amniocentesis.  Because we do not yet know the effectiveness of combined first and second trimester screening, we do not recommend a maternal serum screen to assess the chance for chromosome conditions.  However, if screening for neural tube defects is desired, maternal serum screening for AFP only can be performed between 15 and [redacted] weeks gestation.      Wilburt Finlay, MS, CGC

## 2017-05-30 LAB — SMN1 COPY NUMBER ANALYSIS (SMA CARRIER SCREENING)

## 2017-05-30 LAB — CYSTIC FIBROSIS GENE TEST

## 2017-06-07 ENCOUNTER — Telehealth: Payer: Self-pay | Admitting: Obstetrics and Gynecology

## 2017-06-07 NOTE — Telephone Encounter (Signed)
The results of the SMA carrier screening are now available. We have left messages for Caitlin Young to call our office for these results. SMA is also a recessive genetic condition with variable age of onset and severity caused by mutations in the SMN1 gene.  This carrier testing assesses the number of copies of this gene.  Persons with one copy of the SMN1 gene are carriers, and those with no copies are affected with the condition.  Individuals with two or more copies have a reduced chance to be a carrier.  Not all mutations can be detected with this testing, though it can detect 94.8% of carriers in the Caucasian population and 70.5% of carriers in the African American population.  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  Again, this testing cannot eliminate the chance to have a child with SMA, but significantly reduces the chance.    We encouraged the patient to call with any questions or concerns as they arise.  We may be reached at [phone].  Wilburt Finlay, MS, CGC

## 2017-09-25 NOTE — L&D Delivery Note (Addendum)
Date of delivery: 12/04/17 Estimated Date of Delivery: 12/03/17 Patient's last menstrual period was 02/26/2017. EGA: [redacted]w[redacted]d  Delivery Note At 9:29 AM a viable female was delivered via VBAC. (Presentation: cephalic, precipitous).  APGAR: 8, 9; weight 6lb 15oz, 3147g.   Placenta status:sponatneous , intact.  Cord: 3vv with the following complications: none.  Cord pH: not collected  Anesthesia: none  Episiotomy:  none Lacerations:  2nd degree Suture Repair: 3.0 vicryl Est. Blood Loss (mL):  500cc estimated not measured.  Mom presented to L&D with contraction, SROM; was scheduled for her cesarean today.  She precipitously delivered in the bed.  Baby was placed on mom's chest and was immediately vigorous.  Thick meconium. Cord was then clamped and cut by FOB after a >60sed delay.  Placenta spontaneously delivered, intact.   IM pitocin given for hemorrhage prophylaxis. 2nd degree repaired in standard fashion by CNM McVey. Mom to postpartum.  Baby to Couplet care / Skin to Skin.  Chelsea C Ward 12/04/2017, 9:40 AM

## 2017-11-21 LAB — OB RESULTS CONSOLE GBS: GBS: POSITIVE

## 2017-11-30 NOTE — H&P (Signed)
Caitlin Young is a 27 y.o. G3P1011 with IUP at [redacted]w[redacted]d presenting for presenting for scheduled cesarean section.  No acute concerns.   Prenatal Course Source [facility] Care: [facility]  Pregnancy complications or risks: Patient Active Problem List   Diagnosis Date Noted  . First trimester screening   . S/P cesarean section 04/09/2015  . Indication for care or intervention related to labor and delivery 04/06/2015  . Abdominal pain 03/16/2015  . Labor and delivery, indication for care 02/10/2015  27 y.o. G3P1011 at [redacted]w[redacted]d by  LMP c/w  week ultrasound Sex of baby and name:  "Dickey Gave" baby boy  Factors complicating this pregnancy   Prior CSection: TOLAC if comes in labor, rLTCS at 39wks if not  LEEP  Postive for Group B, will need Abx in labor  Screening results and needs:  NOB:   MBT  O+ Ab screen Negative  Pap Neg HPV Neg G/C Neg/Neg HIV Negative Hep B/RPR Negative/Non reactive  Rubella  Immune  VZV Immune Hgbelectro:  Aneuploidy:   First trimester: WNL  NT: 1.35mm  Second trimester (AFP/tetra):   SMA: carries 1 copy of SMN! Reducing her chance to be a carrier from 1 in 72 to 1:130. Cannot eliminate risk of child with SMA.   28 weeks:   Blood consent: signed 09/20/17  Hgb: 10.0  Plt count: 312  Glucola: 80   Rhogam: n/a  36 weeks:   GBS: pos G/C: negative Hgb: 11.0  RPR:  Non reactive   Last [place]:  03/22/17-vertex, post plac, AFI- 114 cm @40 %, EFW- 3648 g @70 %                   06/27/17-breech, ant plac, cervical length-3.17 cm  Immunization:    Flu in season - 11/27/17 patient states she declined earlier in the pregnancy  Tdap at 27-36 weeks - given 09/20/17  Social: no  changes  Contraception Plan: plans BTL, consent signed 10/10/17  Feeding Plan: plans breastfeeding of colostrum and switching to formula soon after   Prenatal labs and studies: ABO, Rh:   Antibody:   Rubella:   RPR:    HBsAg:    HIV: Non Reactive (07/24 1405)  GBS:  1 hr Glucola  neg Genetic  screening normal Anatomy US normal   Past Medical History:  Diagnosis Date  . Anemia   . Cervical cancer ([place])   . Headache   . Pregnant   . Vaginal Pap smear, abnormal     Past Surgical History:  Procedure Laterality Date  . CESAREAN SECTION N/A 04/09/2015   Procedure: CESAREAN SECTION;  Surgeon: Benjaman Kindler, MD;  Location: [facility] ORS;  Service: Obstetrics;  Laterality: N/A;  . LEEP      OB History  Gravida Para Term Preterm AB Living  3 1 1  0 1 1  SAB TAB Ectopic Multiple Live Births  1 0 0 0 1    # Outcome Date GA Lbr Len/2nd Weight Sex Delivery Anes PTL Lv  3 Current           2 Term 04/09/15 [redacted]w[redacted]d  3.3 kg (7 lb 4.4 oz) F CS-LTranv Spinal  LIV  1 SAB               Social History   Socioeconomic History  . Marital status: Single    Spouse name: Not on file  . Number of children: Not on file  . Years of education: Not on file  . Highest education level:  Not on file  Social Needs  . Financial resource strain: Not on file  . Food insecurity - worry: Not on file  . Food insecurity - inability: Not on file  . Transportation needs - medical: Not on file  . Transportation needs - non-medical: Not on file  Occupational History  . Not on file  Tobacco Use  . Smoking status: Former Smoker    Types: Cigarettes  . Smokeless tobacco: Never Used  . Tobacco comment: quit with +preg test  Substance and Sexual Activity  . Alcohol use: No    Comment: pt had pos drug screen at [facility] but did not disclose at [facility] new OB visit.   . Drug use: No    Comment: dly until +test  . Sexual activity: Yes  Other Topics Concern  . Not on file  Social History Narrative  . Not on file    Family History  Problem Relation Age of Onset  . Diabetes Mother   . Hypertension Mother   . Diabetes Maternal Grandmother   . Cancer Maternal Grandmother        breast  . Hypertension Maternal Grandmother   . Asthma Maternal Grandmother     No medications prior to admission.    No Known  Allergies  Review of Systems: Negative except for what is mentioned in HPI.  Physical Exam: LMP 02/26/2017  FHR by Doppler: 140 bpm CONSTITUTIONAL: Well-developed, well-nourished female in no acute distress.  HENT:  Normocephalic, atraumatic, External right and left ear normal. Oropharynx is clear and moist EYES: Conjunctivae and EOM are normal. Pupils are equal, round, and reactive to light. No scleral icterus.  NECK: Normal range of motion, supple, no masses SKIN: Skin is warm and dry. No rash noted. Not diaphoretic. No erythema. No pallor. [place]: Alert and oriented to person, place, and time. Normal reflexes, muscle tone coordination. No cranial nerve deficit noted. PSYCHIATRIC: Normal mood and affect. Normal behavior. Normal judgment and thought content. CARDIOVASCULAR: Normal heart rate noted, regular rhythm RESPIRATORY: Effort and breath sounds normal, no problems with respiration noted ABDOMEN: Soft, nontender, nondistended, gravid.  PELVIC: Deferred MUSCULOSKELETAL: Normal range of motion. No edema and no tenderness. 2+ distal pulses.   Pertinent Labs/Studies:   No results found for this or any previous visit (from the past 72 hour(s)).  Assessment and Plan :Caitlin Young is a 27 y.o. G3P1011 at [redacted]w[redacted]d being admitted being admitted for scheduled cesarean section. The risks of cesarean section discussed with the patient included but were not limited to: bleeding which may require transfusion or reoperation; infection which may require antibiotics; injury to bowel, bladder, ureters or other surrounding organs; injury to the fetus; need for additional procedures including hysterectomy in the event of a life-threatening hemorrhage; placental abnormalities wth subsequent pregnancies, incisional problems, thromboembolic phenomenon and other postoperative/anesthesia complications. The patient concurred with the proposed plan, giving informed written consent for the procedure. Patient  has been NPO since last night she will remain NPO for procedure. Anesthesia and OR aware. Preoperative prophylactic antibiotics and SCDs ordered on call to the OR. To OR when ready.

## 2017-12-03 ENCOUNTER — Encounter: Payer: Self-pay | Admitting: Anesthesiology

## 2017-12-03 ENCOUNTER — Other Ambulatory Visit: Payer: Self-pay

## 2017-12-03 ENCOUNTER — Encounter
Admission: RE | Admit: 2017-12-03 | Discharge: 2017-12-03 | Disposition: A | Discharge status: Home / Self Care | Payer: Medicaid Other | Source: Ambulatory Visit | Attending: Obstetrics and Gynecology | Admitting: Obstetrics and Gynecology

## 2017-12-03 HISTORY — DX: Other specified postprocedural states: Z98.890

## 2017-12-03 HISTORY — DX: Nausea with vomiting, unspecified: R11.2

## 2017-12-03 LAB — BASIC METABOLIC PANEL
Anion gap: 7 (ref 5–15)
BUN: 8 mg/dL (ref 6–20)
CHLORIDE: 111 mmol/L (ref 101–111)
CO2: 18 mmol/L — AB (ref 22–32)
Calcium: 9 mg/dL (ref 8.9–10.3)
Creatinine, Ser: 0.53 mg/dL (ref 0.44–1.00)
GFR calc Af Amer: >60 mL/min (ref >60)
GFR calc non Af Amer: >60 mL/min (ref >60)
Glucose, Bld: 75 mg/dL (ref 65–99)
POTASSIUM: 3.9 mmol/L (ref 3.5–5.1)
SODIUM: 136 mmol/L (ref 135–145)

## 2017-12-03 LAB — CBC
HEMATOCRIT: 34.2 % — AB (ref 35.0–47.0)
Hemoglobin: 11.6 g/dL — ABNORMAL LOW (ref 12.0–16.0)
MCH: 31.5 pg (ref 26.0–34.0)
MCHC: 33.8 g/dL (ref 32.0–36.0)
MCV: 93.3 fL (ref 80.0–100.0)
Platelets: 303 10*3/uL (ref 150–440)
RBC: 3.67 MIL/uL — AB (ref 3.80–5.20)
RDW: 14.4 % (ref 11.5–14.5)
WBC: 9 10*3/uL (ref 3.6–11.0)

## 2017-12-03 LAB — TYPE AND SCREEN
ABO/RH(D): O POS
Antibody Screen: NEGATIVE
EXTEND SAMPLE REASON: UNDETERMINED

## 2017-12-03 LAB — RAPID HIV SCREEN (HIV 1/2 AB+AG)
HIV 1/2 ANTIBODIES: NONREACTIVE
HIV-1 P24 ANTIGEN - HIV24: NONREACTIVE

## 2017-12-03 NOTE — Patient Instructions (Signed)
Your procedure is scheduled on: Tuesday 12/04/17 Report to [facility] AT 10:00 AM.  Remember: Instructions that are not followed completely may result in serious medical risk, up to and including death, or upon the discretion of your surgeon and anesthesiologist your surgery may need to be rescheduled.     _X__ 1. Do not eat food after midnight the night before your procedure.                 No gum chewing or hard candies. You may drink clear liquids up to 2 hours                 before you are scheduled to arrive for your surgery- DO not drink clear                 liquids within 2 hours of the start of your surgery.                 Clear Liquids include:  water, apple juice without pulp, clear carbohydrate                 drink such as Clearfast or Gatorade, Black Coffee or Tea (Do not add                 anything to coffee or tea).  __X__2.  On the morning of surgery brush your teeth with toothpaste and water, you                 may rinse your mouth with mouthwash if you wish.  Do not swallow any              toothpaste of mouthwash.     _X__ 3.  No Alcohol for 24 hours before or after surgery.   _X__ 4.  Do Not Smoke or use e-cigarettes For 24 Hours Prior to Your Surgery.                 Do not use any chewable tobacco products for at least 6 hours prior to                 surgery.  ____  5.  Bring all medications with you on the day of surgery if instructed.   __X__  6.  Notify your doctor if there is any change in your medical condition      (cold, fever, infections).     Do not wear jewelry, make-up, hairpins, clips or nail polish. Do not wear lotions, powders, or perfumes.  Do not shave 48 hours prior to surgery. Men may shave face and neck. Do not bring valuables to the hospital.    [facility] is not responsible for any belongings or valuables.  Contacts, dentures/partials or body piercings may not be worn into surgery. Bring a case for your  contacts, glasses or hearing aids, a denture cup will be supplied. Leave your suitcase in the car. After surgery it may be brought to your room. For patients admitted to the hospital, discharge time is determined by your treatment team.   Patients discharged the day of surgery will not be allowed to drive home.   Please read over the following fact sheets that you were given:   MRSA Information  __X__ Take these medicines the morning of surgery with A SIP OF WATER:    1. NONE  2.   3.   4.  5.  6.  ____ [employer]  Enema (as directed)   __X__ Use CHG Soap/SAGE wipes as directed AVOID BREASTS  ____ Use inhalers on the day of surgery  ____ Stop metformin/Janumet/Farxiga 2 days prior to surgery    ____ Take 1/2 of usual insulin dose the night before surgery. No insulin the morning          of surgery.   ____ Stop Blood Thinners Coumadin/Plavix/Xarelto/Pleta/Pradaxa/Eliquis/Effient/Aspirin  on   Or contact your Surgeon, Cardiologist or Medical Doctor regarding  ability to stop your blood thinners  __X__ Stop Anti-inflammatories 7 days before surgery such as Advil, Ibuprofen, Motrin,  BC or Goodies Powder, Naprosyn, Naproxen, Aleve, Aspirin    __X__ Stopall herbal supplements, fish oil or vitamin E until after surgery.    ____ Bring C-Pap to the hospital.

## 2017-12-04 ENCOUNTER — Inpatient Hospital Stay
Admission: RE | Admit: 2017-12-04 | Payer: Medicaid Other | Source: Ambulatory Visit | Admitting: Obstetrics and Gynecology

## 2017-12-04 ENCOUNTER — Inpatient Hospital Stay
Admission: EM | Admit: 2017-12-04 | Discharge: 2017-12-06 | DRG: 806 | Disposition: A | Discharge status: Home / Self Care | Payer: Medicaid Other | Attending: Obstetrics and Gynecology | Admitting: Obstetrics and Gynecology

## 2017-12-04 DIAGNOSIS — O99824 Streptococcus B carrier state complicating childbirth: Secondary | ICD-10-CM | POA: Diagnosis present

## 2017-12-04 DIAGNOSIS — O9081 Anemia of the puerperium: Secondary | ICD-10-CM | POA: Diagnosis not present

## 2017-12-04 DIAGNOSIS — Z3A40 40 weeks gestation of pregnancy: Secondary | ICD-10-CM

## 2017-12-04 DIAGNOSIS — O34219 Maternal care for unspecified type scar from previous cesarean delivery: Secondary | ICD-10-CM | POA: Diagnosis present

## 2017-12-04 DIAGNOSIS — D62 Acute posthemorrhagic anemia: Secondary | ICD-10-CM | POA: Diagnosis not present

## 2017-12-04 LAB — CBC
HEMATOCRIT: 35.8 % (ref 35.0–47.0)
HEMOGLOBIN: 12.1 g/dL (ref 12.0–16.0)
MCH: 31.7 pg (ref 26.0–34.0)
MCHC: 33.8 g/dL (ref 32.0–36.0)
MCV: 93.7 fL (ref 80.0–100.0)
Platelets: 280 10*3/uL (ref 150–440)
RBC: 3.82 MIL/uL (ref 3.80–5.20)
RDW: 14.3 % (ref 11.5–14.5)
WBC: 13.7 10*3/uL — ABNORMAL HIGH (ref 3.6–11.0)

## 2017-12-04 LAB — RPR: RPR: NONREACTIVE

## 2017-12-04 SURGERY — Surgical Case
Anesthesia: Choice

## 2017-12-04 MED ORDER — PRENATAL MULTIVITAMIN CH
1.0000 | ORAL_TABLET | Freq: Every day | ORAL | Status: DC
  Administered 2017-12-04 – 2017-12-06 (×3): 1 via ORAL
  Filled 2017-12-04 (×3): qty 1

## 2017-12-04 MED ORDER — ONDANSETRON HCL 4 MG/2ML IJ SOLN: 4.0000 mg | INTRAMUSCULAR | Status: DC | PRN

## 2017-12-04 MED ORDER — SENNOSIDES-DOCUSATE SODIUM 8.6-50 MG PO TABS
2.0000 | ORAL_TABLET | ORAL | Status: DC
  Administered 2017-12-05 – 2017-12-06 (×2): 2 via ORAL
  Filled 2017-12-04 (×2): qty 2

## 2017-12-04 MED ORDER — DIPHENHYDRAMINE HCL 25 MG PO CAPS
25.0000 mg | ORAL_CAPSULE | Freq: Four times a day (QID) | ORAL | Status: DC | PRN
Start: 2017-12-04 — End: 2017-12-06

## 2017-12-04 MED ORDER — ACETAMINOPHEN 325 MG PO TABS: 650.0000 mg | ORAL_TABLET | ORAL | Status: DC | PRN

## 2017-12-04 MED ORDER — OXYTOCIN 40 UNITS IN LACTATED RINGERS INFUSION - SIMPLE MED
2.5000 [IU]/h | INTRAVENOUS | Status: DC
  Administered 2017-12-04: 2.5 [IU]/h via INTRAVENOUS
  Filled 2017-12-04 (×2): qty 1000

## 2017-12-04 MED ORDER — ONDANSETRON HCL 4 MG/2ML IJ SOLN: 4.0000 mg | Freq: Four times a day (QID) | INTRAMUSCULAR | Status: DC | PRN

## 2017-12-04 MED ORDER — WITCH HAZEL-GLYCERIN EX PADS: 1.0000 "application " | MEDICATED_PAD | CUTANEOUS | Status: DC | PRN

## 2017-12-04 MED ORDER — SOD CITRATE-CITRIC ACID 500-334 MG/5ML PO SOLN: 30.0000 mL | ORAL | Status: DC | PRN

## 2017-12-04 MED ORDER — OXYTOCIN BOLUS FROM INFUSION
500.0000 mL | Freq: Once | INTRAVENOUS | Status: AC
  Administered 2017-12-04: 500 mL via INTRAVENOUS

## 2017-12-04 MED ORDER — ONDANSETRON HCL 4 MG PO TABS: 4.0000 mg | ORAL_TABLET | ORAL | Status: DC | PRN

## 2017-12-04 MED ORDER — TETANUS-DIPHTH-ACELL PERTUSSIS 5-2.5-18.5 LF-MCG/0.5 IM SUSP: 0.5000 mL | Freq: Once | INTRAMUSCULAR | Status: DC

## 2017-12-04 MED ORDER — OXYCODONE-ACETAMINOPHEN 5-325 MG PO TABS: 2.0000 | ORAL_TABLET | ORAL | Status: DC | PRN

## 2017-12-04 MED ORDER — OXYTOCIN 10 UNIT/ML IJ SOLN
10.0000 [IU] | Freq: Once | INTRAMUSCULAR | Status: AC
  Administered 2017-12-04: 10 [IU] via INTRAMUSCULAR

## 2017-12-04 MED ORDER — ZOLPIDEM TARTRATE 5 MG PO TABS: 5.0000 mg | ORAL_TABLET | Freq: Every evening | ORAL | Status: DC | PRN

## 2017-12-04 MED ORDER — DIBUCAINE 1 % RE OINT: 1.0000 "application " | TOPICAL_OINTMENT | RECTAL | Status: DC | PRN

## 2017-12-04 MED ORDER — BENZOCAINE-MENTHOL 20-0.5 % EX AERO
1.0000 "application " | INHALATION_SPRAY | CUTANEOUS | Status: DC | PRN
  Administered 2017-12-06: 1 via TOPICAL
  Filled 2017-12-04: qty 56

## 2017-12-04 MED ORDER — LACTATED RINGERS IV SOLN: INTRAVENOUS | Status: DC

## 2017-12-04 MED ORDER — SIMETHICONE 80 MG PO CHEW: 80.0000 mg | CHEWABLE_TABLET | ORAL | Status: DC | PRN

## 2017-12-04 MED ORDER — LIDOCAINE HCL (PF) 1 % IJ SOLN: 30.0000 mL | INTRAMUSCULAR | Status: DC | PRN

## 2017-12-04 MED ORDER — CEFAZOLIN SODIUM-DEXTROSE 2-4 GM/100ML-% IV SOLN
2.0000 g | INTRAVENOUS | Status: AC
  Filled 2017-12-04: qty 100

## 2017-12-04 MED ORDER — IBUPROFEN 600 MG PO TABS
600.0000 mg | ORAL_TABLET | Freq: Four times a day (QID) | ORAL | Status: DC
  Administered 2017-12-04 – 2017-12-06 (×7): 600 mg via ORAL
  Filled 2017-12-04 (×7): qty 1

## 2017-12-04 MED ORDER — OXYCODONE-ACETAMINOPHEN 5-325 MG PO TABS: 1.0000 | ORAL_TABLET | ORAL | Status: DC | PRN

## 2017-12-04 MED ORDER — COCONUT OIL OIL: 1.0000 "application " | TOPICAL_OIL | Status: DC | PRN

## 2017-12-04 NOTE — Discharge Summary (Signed)
Obstetrical Discharge Summary  Patient Name: Caitlin Young DOB: 03-Apr-1991 MRN: [id number]  Date of Admission: 12/04/2017 Date of Delivery: 12/04/17 Delivered by: Larey Days, MD Date of Discharge: 12/06/17 Primary OB: [facility]  [id number] last menstrual period was 02/26/2017. EDC Estimated Date of Delivery: 12/03/17 Gestational Age at Delivery: [redacted]w[redacted]d   Antepartum complications:  Prior cesarean H/o LEEP +GBS  Admitting Diagnosis: labor Secondary Diagnosis: Patient Active Problem List   Diagnosis Date Noted  . First trimester screening   . S/P cesarean section 04/09/2015  . Indication for care or intervention related to labor and delivery 04/06/2015  . Abdominal pain 03/16/2015  . Labor and delivery, indication for care 02/10/2015    Augmentation: none Complications: None Intrapartum complications/course: patient presented in labor, ruptured, and precipitously delivered in the bed through thick meconium.  2nd degree lac repaired. Date of Delivery: 12/04/17 Delivered By: Vikki Ports Ward Delivery Type: vaginal birth after cesarean (VBAC) Anesthesia: none Placenta: sponatneous Laceration: 2nd degree Episiotomy: none Newborn Data: Live born female  Birth Weight: 6 lb 15 oz (3147 g) APGAR: 8, 9   Newborn Delivery   Birth date/time:  12/04/2017 09:29:00 Delivery type:  VBAC, spontaneous     Postpartum Procedures: PPTL was not done - uterus size decreased too far   Post partum course: uncomplicated .Social services did eval  Patient had an uncomplicated postpartum course.  By time of discharge on PPD#2, her pain was controlled on oral pain medications; she had appropriate lochia and was ambulating, voiding without difficulty and tolerating regular diet.  She was deemed stable for discharge to home.    Discharge Physical Exam: 12/06/2017  BP 112/63 (BP Location: Right Arm)   Pulse 64   Temp 98.3 F (36.8 C) (Oral)   Resp 20   Ht 5\' 7"  (1.702 m)   Wt  97.5 kg (215 lb)   LMP 02/26/2017   SpO2 100%   Breastfeeding? Unknown   BMI 33.67 kg/m   General: NAD CV: RRR Pulm: CTABL, nl effort ABD: s/nd/nt, fundus firm and below the umbilicus Lochia: moderate DVT Evaluation: LE non-ttp, no evidence of DVT on exam.  Hemoglobin  Date Value Ref Range Status  12/05/2017 10.4 (L) 12.0 - 16.0 g/dL Final   HGB  Date Value Ref Range Status  09/22/2012 12.9 12.0 - 16.0 g/dL Final   HCT  Date Value Ref Range Status  12/05/2017 30.4 (L) 35.0 - 47.0 % Final  09/22/2012 38.0 35.0 - 47.0 % Final     Disposition: stable, discharge to home. Baby Feeding:formula Baby Disposition: home with mom  Rh Immune globulin given: n/a Rubella vaccine given: n/a Tdap vaccine given in AP or PP setting: AP Flu vaccine given in AP or PP setting: declined  Contraception:will schedule interval L/S btl   Prenatal Labs:  O+ Ab screen Negative Pap Neg HPV Neg G/C Neg/Neg HIV Negative Hep B/RPR Negative/Non reactive  Rubella Immune VZV Immune  Genetic screening: WNL GBS+ GCCT neg   Plan:  Caitlin Young was discharged to home in good condition. Follow-up appointment with Dr Leafy Ro 2 weeks and  Dr. Leonides Schanz in 6 weeks.  Discharge Medications: Allergies as of 12/06/2017   No Known Allergies     Medication List    TAKE these medications   acetaminophen 325 MG tablet Commonly known as:  TYLENOL Take 650-975 mg by mouth every 6 (six) hours as needed (for pain.).   ibuprofen 600 MG tablet Commonly known as:  ADVIL,MOTRIN Take 1 tablet (600  mg total) by mouth every 6 (six) hours.   prenatal multivitamin Tabs tablet Take 1 tablet by mouth daily.       Follow-up Information    Benjaman Kindler, MD Follow up in 2 week(s).   Specialty:  Obstetrics and Gynecology Why:  schedule L/S BTL  Contact information: [place] [address] [phone]           Signed: Gwen Her Edge Mauger MD Hit refresh and delete this  line

## 2017-12-04 NOTE — Lactation Note (Signed)
This note was copied from a baby's chart. Lactation Consultation Note  Patient Name: Caitlin Young Today's Date: 12/04/2017 Reason for consult: Initial assessment   Maternal Data Formula Feeding for Exclusion: No Does the patient have breastfeeding experience prior to this delivery?: Yes Mom states she breast fed first for a few days with nipple shield, just to give colostrum, wants to nurse now to give colostrum, also wants to formula feed,  FEED Feeding Type: Bottle Fed - Formula Nipple Type: Slow - flow Length of feed: 1 min(pushes nipple out with tongue, left breast) Baby sucks on upper lip and tongue, can deeply latch if breast compressed and nipple held in mouth but pushes out nipple if breast let go  LATCH Score Latch: Repeated attempts needed to sustain latch, nipple held in mouth throughout feeding, stimulation needed to elicit sucking reflex.  Audible Swallowing: None  Type of Nipple: Everted at rest and after stimulation(short nipple, areola swollen)  Comfort (Breast/Nipple): Soft / non-tender  Hold (Positioning): Assistance needed to correctly position infant at breast and maintain latch.  LATCH Score: 6 Mom decided to formula feed for now, doesn't want to attempt breastfeeding right now, she will let us know if she wants to breastfeed again. Interventions Interventions: Assisted with latch;Skin to skin;Breast compression;Adjust position;Support pillows;Position options  Lactation Tools Discussed/Used WIC Program: Yes   Consult Status Consult Status: Follow-up Date: 12/05/17 Follow-up type: In-patient    [name] 12/04/2017, 12:19 PM

## 2017-12-05 ENCOUNTER — Encounter: Payer: Self-pay | Admitting: Registered Nurse

## 2017-12-05 ENCOUNTER — Encounter
Admission: EM | Disposition: A | Discharge status: Home / Self Care | Payer: Self-pay | Source: Home / Self Care | Attending: Obstetrics and Gynecology

## 2017-12-05 LAB — URINE DRUG SCREEN, QUALITATIVE (ARMC ONLY)
Amphetamines, Ur Screen: NOT DETECTED
BENZODIAZEPINE, UR SCRN: NOT DETECTED
Barbiturates, Ur Screen: NOT DETECTED
Cannabinoid 50 Ng, Ur ~~LOC~~: POSITIVE — AB
Cocaine Metabolite,Ur ~~LOC~~: NOT DETECTED
MDMA (Ecstasy)Ur Screen: NOT DETECTED
METHADONE SCREEN, URINE: NOT DETECTED
Opiate, Ur Screen: NOT DETECTED
Phencyclidine (PCP) Ur S: NOT DETECTED
TRICYCLIC, UR SCREEN: NOT DETECTED

## 2017-12-05 LAB — CBC
HCT: 30.4 % — ABNORMAL LOW (ref 35.0–47.0)
HEMOGLOBIN: 10.4 g/dL — AB (ref 12.0–16.0)
MCH: 32 pg (ref 26.0–34.0)
MCHC: 34.2 g/dL (ref 32.0–36.0)
MCV: 93.7 fL (ref 80.0–100.0)
Platelets: 296 10*3/uL (ref 150–440)
RBC: 3.25 MIL/uL — AB (ref 3.80–5.20)
RDW: 14.5 % (ref 11.5–14.5)
WBC: 13.8 10*3/uL — ABNORMAL HIGH (ref 3.6–11.0)

## 2017-12-05 LAB — RPR: RPR: NONREACTIVE

## 2017-12-05 SURGERY — LIGATION, FALLOPIAN TUBE, POSTPARTUM
Anesthesia: Choice | Laterality: Bilateral

## 2017-12-05 MED ORDER — LIDOCAINE HCL (PF) 2 % IJ SOLN
INTRAMUSCULAR | Status: AC
  Filled 2017-12-05: qty 10

## 2017-12-05 MED ORDER — PROPOFOL 10 MG/ML IV BOLUS
INTRAVENOUS | Status: AC
  Filled 2017-12-05: qty 20

## 2017-12-05 MED ORDER — SUGAMMADEX SODIUM 200 MG/2ML IV SOLN
INTRAVENOUS | Status: AC
  Filled 2017-12-05: qty 2

## 2017-12-05 MED ORDER — DEXAMETHASONE SODIUM PHOSPHATE 10 MG/ML IJ SOLN
INTRAMUSCULAR | Status: AC
  Filled 2017-12-05: qty 1

## 2017-12-05 MED ORDER — ROCURONIUM BROMIDE 50 MG/5ML IV SOLN
INTRAVENOUS | Status: AC
  Filled 2017-12-05: qty 1

## 2017-12-05 MED ORDER — FENTANYL CITRATE (PF) 100 MCG/2ML IJ SOLN
INTRAMUSCULAR | Status: AC
  Filled 2017-12-05: qty 2

## 2017-12-05 MED ORDER — MIDAZOLAM HCL 2 MG/2ML IJ SOLN
INTRAMUSCULAR | Status: AC
  Filled 2017-12-05: qty 2

## 2017-12-05 MED ORDER — ONDANSETRON HCL 4 MG/2ML IJ SOLN
INTRAMUSCULAR | Status: AC
  Filled 2017-12-05: qty 2

## 2017-12-05 SURGICAL SUPPLY — 30 items
BLADE SURG SZ11 CARB STEEL (BLADE) ×3 IMPLANT
CANISTER SUCT 1200ML W/VALVE (MISCELLANEOUS) ×3 IMPLANT
CHLORAPREP W/TINT 26ML (MISCELLANEOUS) ×3 IMPLANT
DERMABOND ADVANCED (GAUZE/BANDAGES/DRESSINGS) ×2
DERMABOND ADVANCED .7 DNX12 (GAUZE/BANDAGES/DRESSINGS) ×1 IMPLANT
DRAPE LAPAROTOMY 100X77 ABD (DRAPES) ×3 IMPLANT
ELECT REM PT RETURN 9FT ADLT (ELECTROSURGICAL) ×3
ELECTRODE REM PT RTRN 9FT ADLT (ELECTROSURGICAL) ×1 IMPLANT
GLOVE BIO SURGEON STRL SZ7 (GLOVE) ×3 IMPLANT
GLOVE INDICATOR 7.5 STRL GRN (GLOVE) ×3 IMPLANT
GOWN STRL REUS W/ TWL LRG LVL3 (GOWN DISPOSABLE) ×1 IMPLANT
GOWN STRL REUS W/TWL LRG LVL3 (GOWN DISPOSABLE) ×2
KIT TURNOVER CYSTO (KITS) ×3 IMPLANT
LABEL OR SOLS (LABEL) ×3 IMPLANT
LIGASURE IMPACT 36 18CM CVD LR (INSTRUMENTS) IMPLANT
NEEDLE HYPO 22GX1.5 SAFETY (NEEDLE) ×3 IMPLANT
NS IRRIG 500ML POUR BTL (IV SOLUTION) ×3 IMPLANT
PACK BASIN MINOR ARMC (MISCELLANEOUS) ×3 IMPLANT
PAD OB MATERNITY 4.3X12.25 (PERSONAL CARE ITEMS) ×3 IMPLANT
RETRACTOR WOUND ALXS 18CM SML (MISCELLANEOUS) ×1 IMPLANT
RTRCTR WOUND ALEXIS O 18CM SML (MISCELLANEOUS) ×3
SPONGE LAP 4X18 5PK (MISCELLANEOUS) ×3 IMPLANT
SUT CHROMIC GUT BROWN 0 54 (SUTURE) ×1 IMPLANT
SUT CHROMIC GUT BROWN 0 54IN (SUTURE) ×3
SUT MNCRL 4-0 (SUTURE) ×2
SUT MNCRL 4-0 27XMFL (SUTURE) ×1
SUT VIC AB 0 CT2 27 (SUTURE) ×3 IMPLANT
SUT VICRYL 0 AB UR-6 (SUTURE) ×6 IMPLANT
SUTURE MNCRL 4-0 27XMF (SUTURE) ×1 IMPLANT
SYR 10ML LL (SYRINGE) ×3 IMPLANT

## 2017-12-05 NOTE — Progress Notes (Signed)
Post Partum Day 1  Subjective: Doing well, no concerns. Ambulating without difficulty, pain managed with PO meds, tolerating regular diet, and voiding without difficulty. NPO for BTL.   No fever/chills, chest pain, shortness of breath, nausea/vomiting, or leg pain. No nipple or breast pain.   Objective: BP 118/65 (BP Location: Right Arm)   Pulse 66   Temp 98.9 F (37.2 C) (Oral)   Resp 18   Ht 5\' 7"  (1.702 m)   Wt 97.5 kg (215 lb)   LMP 02/26/2017   SpO2 100%   Breastfeeding? Unknown   BMI 33.67 kg/m    Physical Exam:  General: alert, cooperative, appears stated age and no distress Breasts: soft/nontender CV: RRR Pulm: nl effort, CTABL Abdomen: soft, non-tender, active bowel sounds Uterine Fundus: firm Perineum: minimal edema, lacerations hemostatic, repair well approximated Lochia: appropriate DVT Evaluation: No evidence of DVT seen on physical exam. No cords or calf tenderness. No significant calf/ankle edema.  Recent Labs    12/04/17 1247 12/05/17 0648  HGB 12.1 10.4*  HCT 35.8 30.4*  WBC 13.7* 13.8*  PLT 280 296   -UDS positive for cannabinoids   Assessment/Plan: 27 y.o. [id number] postpartum day # 1  -Continue routine PP care -Encouraged snug fitting bra and cabbage leaves for bottlefeeding.  -Scheduled for BTL today for contraception.  -Acute blood loss anemia - hemodynamically stable and asymptomatic. -Social work consult for +UDS for cannabinoid.   Disposition: Continue inpatient postpartum care.    LOS: 1 day   Lisette Grinder, CNM 12/05/2017, 11:30 AM   ----- Lisette Grinder Certified Nurse Midwife [facility]

## 2017-12-05 NOTE — Clinical Social Work Maternal (Signed)
  CLINICAL SOCIAL WORK MATERNAL/CHILD NOTE  Patient Details  Name: Caitlin Young MRN: [id number] Date of Birth: 06-08-1991  Date:  12/05/2017  Clinical Social Worker Initiating Note:  Shela Leff MSW,LCSW Date/Time: Initiated:  12/05/17/      Child's Name:      Biological Parents:  Mother, Father   Need for Interpreter:  None   Reason for Referral:  Current Substance Use/Substance Use During Pregnancy    Address:  [place] [address]    Phone number:  [phone] (home)     Additional phone number: none  Household Members/Support Persons (HM/SP):       HM/SP Name Relationship DOB or Age  HM/SP -1        HM/SP -2        HM/SP -3        HM/SP -4        HM/SP -5        HM/SP -6        HM/SP -7        HM/SP -8          Natural Supports (not living in the home):      Professional Supports: None   Employment: Full-time   Type of Work:     Education:      Homebound arranged:    Museum/gallery curator Resources:  Medicaid   Other Resources:      Cultural/Religious Considerations Which May Impact Care:  none  Strengths:  Ability to meet basic needs , Home prepared for child    Psychotropic Medications:         Pediatrician:       Pediatrician List:   [place]      Pediatrician Fax Number:    Risk Factors/Current Problems:  Substance Use    Cognitive State:  Able to Concentrate , Alert    Mood/Affect:  Happy    CSW Assessment: CSW received consult due to patient and her newborn testing positive for marijuana in their urine drug screens. CSW met with patient this afternoon. Patient's significant other was sleeping in the bedside chair. Patient was in agreement to speak with CSW even with her significant other present. Patient reports that they have all necessities for their newborn. Patient state she has no financial concerns and that  they both are employed. They have a 28 year old in the home. They have no concerns regarding transportation. Patient reports no history of mental illness. She has been educated on postpartum depression. She reports using marijuana during her pregnancy for nausea and is aware that she and her newborn were both positive. CSW informed her that a DSS CPS report would be made and what that entailed. Patient asked appropriate questions.   CSW has made DSS CPS report.  CSW Plan/Description:  Child Protective Service Report     Shela Leff, LCSW 12/05/2017, 4:11 PM

## 2017-12-06 MED ORDER — IBUPROFEN 600 MG PO TABS: 600.0000 mg | ORAL_TABLET | Freq: Four times a day (QID) | ORAL | 0 refills | Status: DC

## 2017-12-06 NOTE — Discharge Instructions (Signed)
Please call your doctor or return to the ER if you experience any chest pains, shortness of breath, dizziness, visual changes, fever greater than 101, any heavy bleeding (saturating more than 1 pad per hour), large clots, or foul smelling discharge, any worsening abdominal pain and cramping that is not controlled by pain medication, or any signs of postpartum depression. No tampons, enemas, douches, or sexual intercourse for 6 weeks. Also avoid tub baths, hot tubs, or swimming for 6 weeks.  °

## 2017-12-06 NOTE — Progress Notes (Signed)
Discharge order received from doctor. Reviewed discharge instructions and prescriptions with patient and answered all questions. Follow up appointment instructions given. Patient verbalized understanding. ID bands checked. Patient discharged home with infant via wheelchair by nursing/auxillary.    Malcomb Gangemi Garner, RN  
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
## 2017-12-31 ENCOUNTER — Inpatient Hospital Stay: Admission: RE | Admit: 2017-12-31 | Payer: Medicaid Other | Source: Ambulatory Visit

## 2018-01-01 ENCOUNTER — Encounter
Admission: RE | Admit: 2018-01-01 | Discharge: 2018-01-01 | Disposition: A | Discharge status: Home / Self Care | Payer: Medicaid Other | Source: Ambulatory Visit | Attending: Obstetrics and Gynecology | Admitting: Obstetrics and Gynecology

## 2018-01-01 ENCOUNTER — Other Ambulatory Visit: Payer: Self-pay

## 2018-01-01 NOTE — Patient Instructions (Signed)
Your procedure is scheduled on: 01-04-18 FRIDAY Report to Same Day Surgery 2nd floor medical mall [facility] Entrance-take elevator on left to 2nd floor.  Check in with surgery information desk.) To find out your arrival time please call [phone] between 1PM - 3PM on 01-03-18 THURSDAY  Remember: Instructions that are not followed completely may result in serious medical risk, up to and including death, or upon the discretion of your surgeon and anesthesiologist your surgery may need to be rescheduled.    _x___ 1. Do not eat food after midnight the night before your procedure. NO GUM OR CANDY AFTER MIDNIGHT.  You may drink clear liquids up to 2 hours before you are scheduled to arrive at the hospital for your procedure.  Do not drink clear liquids within 2 hours of your scheduled arrival to the hospital.  Clear liquids include  --Water or Apple juice without pulp  --Clear carbohydrate beverage such as ClearFast or Gatorade  --Black Coffee or Clear Tea (No milk, no creamers, do not add anything to the coffee or Tea     __x__ 2. No Alcohol for 24 hours before or after surgery.   __x__3. No Smoking or e-cigarettes for 24 prior to surgery.  Do not use any chewable tobacco products for at least 6 hour prior to surgery   ____  4. Bring all medications with you on the day of surgery if instructed.    __x__ 5. Notify your doctor if there is any change in your medical condition     (cold, fever, infections).    x___6. On the morning of surgery brush your teeth with toothpaste and water.  You may rinse your mouth with mouth wash if you wish.  Do not swallow any toothpaste or mouthwash.   Do not wear jewelry, make-up, hairpins, clips or nail polish.  Do not wear lotions, powders, or perfumes. You may wear deodorant.  Do not shave 48 hours prior to surgery. Men may shave face and neck.  Do not bring valuables to the hospital.    [facility] is not responsible for any belongings or  valuables.               Contacts, dentures or bridgework may not be worn into surgery.  Leave your suitcase in the car. After surgery it may be brought to your room.  For patients admitted to the hospital, discharge time is determined by your  treatment team.  _  Patients discharged the day of surgery will not be allowed to drive home.  You will need someone to drive you home and stay with you the night of your procedure.    Please read over the following fact sheets that you were given:   [facility] Preparing for Surgery and or MRSA Information   ____ Take anti-hypertensive listed below, cardiac, seizure, asthma, anti-reflux and psychiatric medicines. These include:  1. NONE  2.  3.  4.  5.  6.  ____Fleets enema or Magnesium Citrate as directed.   _x___ Use CHG Soap or sage wipes as directed on instruction sheet   ____ Use inhalers on the day of surgery and bring to hospital day of surgery  ____ Stop Metformin and Janumet 2 days prior to surgery.    ____ Take 1/2 of usual insulin dose the night before surgery and none on the morning surgery.   ____ Follow recommendations from Cardiologist, Pulmonologist or PCP regarding stopping Aspirin, Coumadin, Plavix ,Eliquis, Effient, or Pradaxa, and Pletal.  X____Stop Anti-inflammatories such as Advil, Aleve, Ibuprofen, Motrin, Naproxen, Naprosyn, Goodies powders or aspirin products NOW-OK to take Tylenol    ____ Stop supplements until after surgery.   ____ Bring C-Pap to the hospital.

## 2018-01-03 ENCOUNTER — Inpatient Hospital Stay: Admission: RE | Admit: 2018-01-03 | Payer: Medicaid Other | Source: Ambulatory Visit

## 2018-01-04 ENCOUNTER — Encounter
Admission: RE | Disposition: A | Discharge status: Home / Self Care | Payer: Self-pay | Source: Ambulatory Visit | Attending: Obstetrics and Gynecology

## 2018-01-04 ENCOUNTER — Ambulatory Visit: Payer: Medicaid Other | Admitting: Certified Registered"

## 2018-01-04 ENCOUNTER — Other Ambulatory Visit: Payer: Self-pay

## 2018-01-04 ENCOUNTER — Encounter: Payer: Self-pay | Admitting: *Deleted

## 2018-01-04 ENCOUNTER — Ambulatory Visit
Admission: RE | Admit: 2018-01-04 | Discharge: 2018-01-04 | Disposition: A | Discharge status: Home / Self Care | Payer: Medicaid Other | Source: Ambulatory Visit | Attending: Obstetrics and Gynecology | Admitting: Obstetrics and Gynecology

## 2018-01-04 DIAGNOSIS — Z79899 Other long term (current) drug therapy: Secondary | ICD-10-CM | POA: Insufficient documentation

## 2018-01-04 DIAGNOSIS — Z6830 Body mass index (BMI) 30.0-30.9, adult: Secondary | ICD-10-CM | POA: Insufficient documentation

## 2018-01-04 DIAGNOSIS — Z7722 Contact with and (suspected) exposure to environmental tobacco smoke (acute) (chronic): Secondary | ICD-10-CM | POA: Diagnosis not present

## 2018-01-04 DIAGNOSIS — E669 Obesity, unspecified: Secondary | ICD-10-CM | POA: Insufficient documentation

## 2018-01-04 DIAGNOSIS — Z302 Encounter for sterilization: Secondary | ICD-10-CM | POA: Insufficient documentation

## 2018-01-04 HISTORY — PX: LAPAROSCOPIC TUBAL LIGATION: SHX1937

## 2018-01-04 LAB — CBC
HCT: 36.3 % (ref 35.0–47.0)
HEMOGLOBIN: 12.3 g/dL (ref 12.0–16.0)
MCH: 32 pg (ref 26.0–34.0)
MCHC: 33.8 g/dL (ref 32.0–36.0)
MCV: 94.9 fL (ref 80.0–100.0)
Platelets: 319 10*3/uL (ref 150–440)
RBC: 3.83 MIL/uL (ref 3.80–5.20)
RDW: 13.1 % (ref 11.5–14.5)
WBC: 6.2 10*3/uL (ref 3.6–11.0)

## 2018-01-04 LAB — BASIC METABOLIC PANEL
Anion gap: 5 (ref 5–15)
BUN: 8 mg/dL (ref 6–20)
CALCIUM: 8.7 mg/dL — AB (ref 8.9–10.3)
CHLORIDE: 109 mmol/L (ref 101–111)
CO2: 25 mmol/L (ref 22–32)
CREATININE: 0.78 mg/dL (ref 0.44–1.00)
GFR calc Af Amer: >60 mL/min (ref >60)
GFR calc non Af Amer: >60 mL/min (ref >60)
Glucose, Bld: 86 mg/dL (ref 65–99)
Potassium: 4 mmol/L (ref 3.5–5.1)
Sodium: 139 mmol/L (ref 135–145)

## 2018-01-04 LAB — TYPE AND SCREEN
ABO/RH(D): O POS
Antibody Screen: NEGATIVE

## 2018-01-04 LAB — URINE DRUG SCREEN, QUALITATIVE (ARMC ONLY)
Amphetamines, Ur Screen: NOT DETECTED
BARBITURATES, UR SCREEN: NOT DETECTED
Benzodiazepine, Ur Scrn: NOT DETECTED
COCAINE METABOLITE, UR ~~LOC~~: NOT DETECTED
Cannabinoid 50 Ng, Ur ~~LOC~~: POSITIVE — AB
MDMA (ECSTASY) UR SCREEN: NOT DETECTED
METHADONE SCREEN, URINE: NOT DETECTED
OPIATE, UR SCREEN: NOT DETECTED
Phencyclidine (PCP) Ur S: NOT DETECTED
TRICYCLIC, UR SCREEN: NOT DETECTED

## 2018-01-04 LAB — PREGNANCY, URINE: PREG TEST UR: NEGATIVE

## 2018-01-04 LAB — POCT PREGNANCY, URINE: Preg Test, Ur: NEGATIVE

## 2018-01-04 SURGERY — LIGATION, FALLOPIAN TUBE, LAPAROSCOPIC
Anesthesia: General | Laterality: Bilateral

## 2018-01-04 MED ORDER — PROPOFOL 500 MG/50ML IV EMUL
INTRAVENOUS | Status: DC | PRN
  Administered 2018-01-04: 25 ug/kg/min via INTRAVENOUS

## 2018-01-04 MED ORDER — ONDANSETRON HCL 4 MG/2ML IJ SOLN
INTRAMUSCULAR | Status: AC
Start: 2018-01-04 — End: ?
  Filled 2018-01-04: qty 2

## 2018-01-04 MED ORDER — ONDANSETRON HCL 4 MG/2ML IJ SOLN
INTRAMUSCULAR | Status: AC
  Filled 2018-01-04: qty 2

## 2018-01-04 MED ORDER — HYDROMORPHONE HCL 1 MG/ML IJ SOLN
INTRAMUSCULAR | Status: DC | PRN
  Administered 2018-01-04: 1 mg via INTRAVENOUS

## 2018-01-04 MED ORDER — FAMOTIDINE 20 MG PO TABS
20.0000 mg | ORAL_TABLET | Freq: Once | ORAL | Status: AC
  Administered 2018-01-04: 20 mg via ORAL

## 2018-01-04 MED ORDER — ONDANSETRON HCL 4 MG/2ML IJ SOLN
INTRAMUSCULAR | Status: DC | PRN
  Administered 2018-01-04: 4 mg via INTRAVENOUS

## 2018-01-04 MED ORDER — ROCURONIUM BROMIDE 50 MG/5ML IV SOLN
INTRAVENOUS | Status: AC
  Filled 2018-01-04: qty 1

## 2018-01-04 MED ORDER — IBUPROFEN 800 MG PO TABS: 800.0000 mg | ORAL_TABLET | Freq: Three times a day (TID) | ORAL | 1 refills | Status: DC | PRN

## 2018-01-04 MED ORDER — KETOROLAC TROMETHAMINE 30 MG/ML IJ SOLN
INTRAMUSCULAR | Status: AC
Start: 2018-01-04 — End: ?
  Filled 2018-01-04: qty 1

## 2018-01-04 MED ORDER — GABAPENTIN 300 MG PO CAPS
ORAL_CAPSULE | ORAL | Status: AC
  Filled 2018-01-04: qty 3

## 2018-01-04 MED ORDER — DIPHENHYDRAMINE HCL 50 MG/ML IJ SOLN
INTRAMUSCULAR | Status: DC | PRN
  Administered 2018-01-04: 12.5 mg via INTRAVENOUS

## 2018-01-04 MED ORDER — BUPIVACAINE HCL (PF) 0.5 % IJ SOLN
INTRAMUSCULAR | Status: AC
  Filled 2018-01-04: qty 30

## 2018-01-04 MED ORDER — LIDOCAINE HCL (CARDIAC) 20 MG/ML IV SOLN
INTRAVENOUS | Status: DC | PRN
  Administered 2018-01-04: 50 mg via INTRAVENOUS

## 2018-01-04 MED ORDER — FENTANYL CITRATE (PF) 100 MCG/2ML IJ SOLN: 25.0000 ug | INTRAMUSCULAR | Status: DC | PRN

## 2018-01-04 MED ORDER — GABAPENTIN 300 MG PO CAPS
900.0000 mg | ORAL_CAPSULE | ORAL | Status: AC
  Administered 2018-01-04: 900 mg via ORAL

## 2018-01-04 MED ORDER — ROCURONIUM BROMIDE 100 MG/10ML IV SOLN
INTRAVENOUS | Status: DC | PRN
  Administered 2018-01-04: 50 mg via INTRAVENOUS

## 2018-01-04 MED ORDER — LACTATED RINGERS IV SOLN: INTRAVENOUS | Status: DC

## 2018-01-04 MED ORDER — ACETAMINOPHEN 500 MG PO TABS: 1000.0000 mg | ORAL_TABLET | Freq: Four times a day (QID) | ORAL | 0 refills | Status: AC

## 2018-01-04 MED ORDER — GLYCOPYRROLATE 0.2 MG/ML IJ SOLN
INTRAMUSCULAR | Status: AC
  Filled 2018-01-04: qty 1

## 2018-01-04 MED ORDER — PROPOFOL 10 MG/ML IV BOLUS
INTRAVENOUS | Status: AC
  Filled 2018-01-04: qty 20

## 2018-01-04 MED ORDER — PROPOFOL 10 MG/ML IV BOLUS
INTRAVENOUS | Status: AC
Start: 2018-01-04 — End: ?
  Filled 2018-01-04: qty 40

## 2018-01-04 MED ORDER — DEXAMETHASONE SODIUM PHOSPHATE 10 MG/ML IJ SOLN
INTRAMUSCULAR | Status: DC | PRN
  Administered 2018-01-04: 10 mg via INTRAVENOUS

## 2018-01-04 MED ORDER — SUGAMMADEX SODIUM 200 MG/2ML IV SOLN
INTRAVENOUS | Status: AC
Start: 2018-01-04 — End: ?
  Filled 2018-01-04: qty 2

## 2018-01-04 MED ORDER — GLYCOPYRROLATE 0.2 MG/ML IJ SOLN
INTRAMUSCULAR | Status: DC | PRN
  Administered 2018-01-04: 0.1 mg via INTRAVENOUS

## 2018-01-04 MED ORDER — OXYCODONE HCL 5 MG PO CAPS: 5.0000 mg | ORAL_CAPSULE | Freq: Four times a day (QID) | ORAL | 0 refills | Status: DC | PRN

## 2018-01-04 MED ORDER — KETOROLAC TROMETHAMINE 30 MG/ML IJ SOLN
INTRAMUSCULAR | Status: AC
  Filled 2018-01-04: qty 1

## 2018-01-04 MED ORDER — KETOROLAC TROMETHAMINE 30 MG/ML IJ SOLN
INTRAMUSCULAR | Status: DC | PRN
  Administered 2018-01-04: 30 mg via INTRAVENOUS

## 2018-01-04 MED ORDER — ONDANSETRON HCL 4 MG/2ML IJ SOLN
4.0000 mg | Freq: Once | INTRAMUSCULAR | Status: AC | PRN
  Administered 2018-01-04: 4 mg via INTRAVENOUS

## 2018-01-04 MED ORDER — ACETAMINOPHEN 500 MG PO TABS
ORAL_TABLET | ORAL | Status: AC
  Filled 2018-01-04: qty 2

## 2018-01-04 MED ORDER — PROPOFOL 10 MG/ML IV BOLUS
INTRAVENOUS | Status: DC | PRN
  Administered 2018-01-04: 150 mg via INTRAVENOUS

## 2018-01-04 MED ORDER — LIDOCAINE HCL (PF) 2 % IJ SOLN
INTRAMUSCULAR | Status: AC
Start: 2018-01-04 — End: ?
  Filled 2018-01-04: qty 10

## 2018-01-04 MED ORDER — MIDAZOLAM HCL 2 MG/2ML IJ SOLN
INTRAMUSCULAR | Status: AC
  Filled 2018-01-04: qty 2

## 2018-01-04 MED ORDER — LACTATED RINGERS IV SOLN
INTRAVENOUS | Status: DC
  Administered 2018-01-04 (×2): via INTRAVENOUS

## 2018-01-04 MED ORDER — ACETAMINOPHEN 500 MG PO TABS
1000.0000 mg | ORAL_TABLET | ORAL | Status: AC
  Administered 2018-01-04: 1000 mg via ORAL

## 2018-01-04 MED ORDER — FAMOTIDINE 20 MG PO TABS
ORAL_TABLET | ORAL | Status: AC
  Filled 2018-01-04: qty 1

## 2018-01-04 MED ORDER — DEXAMETHASONE SODIUM PHOSPHATE 10 MG/ML IJ SOLN
INTRAMUSCULAR | Status: AC
  Filled 2018-01-04: qty 1

## 2018-01-04 MED ORDER — SUGAMMADEX SODIUM 200 MG/2ML IV SOLN
INTRAVENOUS | Status: DC | PRN
  Administered 2018-01-04: 200 mg via INTRAVENOUS

## 2018-01-04 MED ORDER — DOCUSATE SODIUM 100 MG PO CAPS: 100.0000 mg | ORAL_CAPSULE | Freq: Two times a day (BID) | ORAL | 0 refills | Status: DC

## 2018-01-04 MED ORDER — HYDROMORPHONE HCL 1 MG/ML IJ SOLN
INTRAMUSCULAR | Status: AC
  Filled 2018-01-04: qty 1

## 2018-01-04 MED ORDER — MIDAZOLAM HCL 2 MG/2ML IJ SOLN
INTRAMUSCULAR | Status: DC | PRN
  Administered 2018-01-04: 2 mg via INTRAVENOUS

## 2018-01-04 SURGICAL SUPPLY — 30 items
BLADE SURG SZ11 CARB STEEL (BLADE) ×3 IMPLANT
CATH ROBINSON RED A/P 16FR (CATHETERS) ×3 IMPLANT
CHLORAPREP W/TINT 26ML (MISCELLANEOUS) ×3 IMPLANT
CLOSURE WOUND 1/4X4 (GAUZE/BANDAGES/DRESSINGS) ×1
DERMABOND ADVANCED (GAUZE/BANDAGES/DRESSINGS) ×2
DERMABOND ADVANCED .7 DNX12 (GAUZE/BANDAGES/DRESSINGS) ×1 IMPLANT
DRAPE UTILITY 15X26 TOWEL STRL (DRAPES) ×6 IMPLANT
GLOVE BIO SURGEON STRL SZ7 (GLOVE) ×3 IMPLANT
GLOVE INDICATOR 7.5 STRL GRN (GLOVE) ×3 IMPLANT
GOWN STRL REUS W/ TWL LRG LVL3 (GOWN DISPOSABLE) ×2 IMPLANT
GOWN STRL REUS W/TWL LRG LVL3 (GOWN DISPOSABLE) ×4
GRASPER SUT TROCAR 14GX15 (MISCELLANEOUS) ×3 IMPLANT
KIT TURNOVER CYSTO (KITS) ×3 IMPLANT
LABEL OR SOLS (LABEL) ×3 IMPLANT
LIGASURE VESSEL 5MM BLUNT TIP (ELECTROSURGICAL) IMPLANT
NS IRRIG 500ML POUR BTL (IV SOLUTION) ×3 IMPLANT
PACK GYN LAPAROSCOPIC (MISCELLANEOUS) ×3 IMPLANT
PAD OB MATERNITY 4.3X12.25 (PERSONAL CARE ITEMS) ×3 IMPLANT
PAD PREP 24X41 OB/GYN DISP (PERSONAL CARE ITEMS) ×3 IMPLANT
SEALER TISSUE G2 STRG ARTC 35C (ENDOMECHANICALS) ×3 IMPLANT
SLEEVE ENDOPATH XCEL 5M (ENDOMECHANICALS) ×3 IMPLANT
STRIP CLOSURE SKIN 1/4X4 (GAUZE/BANDAGES/DRESSINGS) ×2 IMPLANT
SUT MNCRL 4-0 (SUTURE) ×2
SUT MNCRL 4-0 27XMFL (SUTURE) ×1
SUT VIC AB 0 UR5 27 (SUTURE) ×3 IMPLANT
SUT VIC AB 2-0 UR6 27 (SUTURE) ×3 IMPLANT
SUTURE MNCRL 4-0 27XMF (SUTURE) ×1 IMPLANT
TROCAR ENDO BLADELESS 11MM (ENDOMECHANICALS) ×3 IMPLANT
TROCAR XCEL NON-BLD 5MMX100MML (ENDOMECHANICALS) ×3 IMPLANT
TUBING INSUFFLATION (TUBING) ×3 IMPLANT

## 2018-01-04 NOTE — Interval H&P Note (Signed)
History and Physical Interval Note:  01/04/2018 10:43 AM  Caitlin Young  has presented today for surgery, with the diagnosis of Patient desires permanent sterilization  The various methods of treatment have been discussed with the patient and family. After consideration of risks, benefits and other options for treatment, the patient has consented to  Procedure(s): LAPAROSCOPIC TUBAL LIGATION (Bilateral) as a surgical intervention .  The patient's history has been reviewed, patient examined, no change in status, stable for surgery.  I have reviewed the patient's chart and labs.  Questions were answered to the patient's satisfaction.     Benjaman Kindler

## 2018-01-04 NOTE — Anesthesia Preprocedure Evaluation (Signed)
Anesthesia Evaluation  Patient identified by MRN, date of birth, ID band Patient awake    Reviewed: Allergy & Precautions, NPO status , Patient's Chart, lab work & pertinent test results, reviewed documented beta blocker date and time   Airway Mallampati: III  TM Distance: >3 FB     Dental  (+) Chipped   Pulmonary former smoker,    Pulmonary exam normal        Cardiovascular Normal cardiovascular exam     Neuro/Psych    GI/Hepatic   Endo/Other    Renal/GU      Musculoskeletal   Abdominal Normal abdominal exam  (+)   Peds  Hematology   Anesthesia Other Findings   Reproductive/Obstetrics                             Anesthesia Physical  Anesthesia Plan  ASA: II  Anesthesia Plan: General   Post-op Pain Management:    Induction: Intravenous  PONV Risk Score and Plan:   Airway Management Planned: Oral ETT  Additional Equipment:   Intra-op Plan:   Post-operative Plan: Extubation in OR  Informed Consent: I have reviewed the patients History and Physical, chart, labs and discussed the procedure including the risks, benefits and alternatives for the proposed anesthesia with the patient or authorized representative who has indicated his/her understanding and acceptance.     Plan Discussed with: CRNA  Anesthesia Plan Comments:         Anesthesia Quick Evaluation

## 2018-01-04 NOTE — OR Nursing (Signed)
Discharge pending MD signature on RX

## 2018-01-04 NOTE — Anesthesia Procedure Notes (Signed)
Procedure Name: Intubation Date/Time: 01/04/2018 11:19 AM Performed by: Nile Riggs, CRNA Pre-anesthesia Checklist: Patient identified, Emergency Drugs available, Suction available, Patient being monitored and Timeout performed Patient Re-evaluated:Patient Re-evaluated prior to induction Oxygen Delivery Method: Circle system utilized Preoxygenation: Pre-oxygenation with 100% oxygen Induction Type: IV induction Ventilation: Mask ventilation without difficulty Laryngoscope Size: Miller and 2 Grade View: Grade I Tube type: Oral Tube size: 7.5 mm Number of attempts: 1 Airway Equipment and Method: Stylet Placement Confirmation: ETT inserted through vocal cords under direct vision,  positive ETCO2,  CO2 detector and breath sounds checked- equal and bilateral Secured at: 21 cm Tube secured with: Tape Dental Injury: Teeth and Oropharynx as per pre-operative assessment

## 2018-01-04 NOTE — Discharge Instructions (Addendum)
Laparoscopic Tubal Ligation Discharge Instructions  Laparoscopic tubal ligation and fulguration ties your fallopian tubes to prevent pregnancy in the future.   For the next three days, take ibuprofen and acetaminophen on a schedule, every 8 hours. You can take them together or you can intersperse them, and take one every four hours. I also gave you gabapentin for nighttime, to help you sleep and also to control pain. Take gabapentin medicines at night for at least the next 3 nights. You also have a narcotic, oxycodone, to take as needed if the above medicines don't help.  Postop constipation is a major cause of pain. Stay well hydrated, walk as you tolerate, and take over the counter senna as well as stool softeners if you need them.  RISKS AND COMPLICATIONS   Infection.  Bleeding.  Injury to surrounding organs.  Anesthetic side effects.  Failure of the procedure.  Risks of future ectopic pregnancy  PROCEDURE   You may be given a medicine to help you relax (sedative) before the procedure. You will be given a medicine to make you sleep (general anesthetic) during the procedure.  A tube will be put down your throat to help your breath while under general anesthesia.  Two small cuts (incisions) are made in the lower abdominal area and one incision is made near the belly button.  Your abdominal area will be inflated with a safe gas (carbon dioxide). This helps give the surgeon room to operate, visualize, and helps the surgeon avoid other organs.  A thin, lighted tube (laparoscope) with a camera attached is inserted into your abdomen through the incision near the belly button. Other small instruments may also be inserted through other abdominal incisions.  The fallopian tube is located and are removed.  After the fallopian tube is removed, the gas is released from the abdomen.  The incisions will be closed with stitches (sutures), and Dermabond. A bandage may be placed over the  incisions.  AFTER THE PROCEDURE   You will also have some mild abdominal discomfort for 3-7 days. You will be given pain medicine to ease any discomfort.  As long as there are no problems, you may be allowed to go home. Someone will need to drive you home and be with you for at least 24 hours once home.  You may have some mild discomfort in the throat. This is from the tube placed in your throat while you were sleeping.  You may experience discomfort in the shoulder area from some trapped air between the liver and diaphragm. This sensation is normal and will slowly go away on its own.  HOME CARE INSTRUCTIONS   Take all medicines as directed.  Only take over-the-counter or prescription medicines for pain, discomfort, or fever as directed by your caregiver.  Resume daily activities as directed.  Showers are preferred over baths.  You may resume sexual activities in 1 week or as directed.  Do not drive while taking narcotics.  SEEK MEDICAL CARE IF: .  There is increasing abdominal pain.  You feel lightheaded or faint.  You have the chills.  You have an oral temperature above 102 F (38.9 C).  There is pus-like (purulent) drainage from any of the wounds.  You are unable to pass gas or have a bowel movement.  You feel sick to your stomach (nauseous) or throw up (vomit).  MAKE SURE YOU:   Understand these instructions.  Will watch your condition.  Will get help right away if you are not doing  well or get worse.  ExitCare Patient Information 2013 [place].    AMBULATORY SURGERY  DISCHARGE INSTRUCTIONS   1) The drugs that you were given will stay in your system until tomorrow so for the next 24 hours you should not:  A) Drive an automobile B) Make any legal decisions C) Drink any alcoholic beverage   2) You may resume regular meals tomorrow.  Today it is better to start with liquids and gradually work up to solid foods.  You may eat anything you  prefer, but it is better to start with liquids, then soup and crackers, and gradually work up to solid foods.   3) Please notify your doctor immediately if you have any unusual bleeding, trouble breathing, redness and pain at the surgery site, drainage, fever, or pain not relieved by medication.    4) Additional Instructions:        Please contact your physician with any problems or Same Day Surgery at [phone], Monday through Friday 6 am to 4 pm, or [place] at [facility] number at [phone].

## 2018-01-04 NOTE — Transfer of Care (Signed)
Immediate Anesthesia Transfer of Care Note  Patient: Caitlin Young  Procedure(s) Performed: LAPAROSCOPIC TUBAL LIGATION (Bilateral )  Patient Location: PACU  Anesthesia Type:General  Level of Consciousness: drowsy and patient cooperative  Airway & Oxygen Therapy: Patient Spontanous Breathing and Patient connected to face mask oxygen  Post-op Assessment: Report given to RN, Post -op Vital signs reviewed and stable and Patient moving all extremities  Post vital signs: Reviewed and stable  Last Vitals:  Vitals Value Taken Time  BP 119/85 01/04/2018 12:19 PM  Temp 37 C 01/04/2018 12:19 PM  Pulse 60 01/04/2018 12:20 PM  Resp 18 01/04/2018 12:20 PM  SpO2 98 % 01/04/2018 12:20 PM  Vitals shown include unvalidated device data.  Last Pain:  Vitals:   01/04/18 1219  TempSrc: Temporal  PainSc: 0-No pain         Complications: No apparent anesthesia complications

## 2018-01-04 NOTE — Anesthesia Post-op Follow-up Note (Signed)
Anesthesia QCDR form completed.        

## 2018-01-04 NOTE — Op Note (Signed)
Caitlin Young 01/04/2018  PREOPERATIVE DIAGNOSIS:  Undesired fertility  POSTOPERATIVE DIAGNOSIS:  Undesired fertility  PROCEDURE:  Laparoscopic Bilateral Tubal Sterilization using Bipolar Coagulation with partial salpingectomy, including fimbriae   ANESTHESIA:  General endotracheal  ANESTHESIOLOGIST: Alvin Critchley, MD Anesthesiologist: Alvin Critchley, MD CRNA: Nile Riggs, CRNA  SURGEON: Angelina Pih, MD  COMPLICATIONS:  None immediate.  ESTIMATED BLOOD LOSS:  Less than 20 ml.  FLUIDS: 1200 ml LR.  URINE OUTPUT:  100 ml of clear urine.  INDICATIONS: 27 y.o. [id number]  with undesired fertility, desires permanent sterilization. Other reversible forms of contraception were discussed with patient; she declines all other modalities.  Risks of procedure discussed with patient including permanence of method, bleeding, infection, injury to surrounding organs and need for additional procedures including laparotomy, risk of regret.  Failure risk of 0.5-1% with increased risk of ectopic gestation if pregnancy occurs was also discussed with patient.      FINDINGS:  Normal uterus, tubes, and ovaries. Normal upper abdomen.  TECHNIQUE:  The patient was taken to the operating room where general anesthesia was obtained without difficulty.  She was then placed in the dorsal lithotomy position and prepared and draped in sterile fashion.  The bladder was cathed for an estimated amount of clear urine. After an adequate timeout was performed, a bivalved speculum was then placed in the patient's vagina, and the anterior lip of cervix grasped with the single-tooth tenaculum.  The uterine manipulator was then advanced into the uterus.  The speculum was removed from the vagina.   Attention was then turned to the patient's abdomen where a 5-mm skin incision was made in the umbilical fold.  The Optiview 5-mm trocar and sleeve were then advanced without difficulty with the laparoscope under direct  visualization into the abdomen.  The abdomen was then insufflated with carbon dioxide gas and adequate pneumoperitoneum was obtained.  A survey of the patient's pelvis and abdomen revealed entirely normal anatomy.     The fallopian tubes were observed and found to be normal in appearance. A 16mm port was placed in the suprapuic midline under direct visualization. A Ligasure device was then advanced through the operative port and used to coagulate and excise the distal portion of the Fallopian tube, including the fimbriated ends.  Good blanching and coagulation was noted at the site of the application.  There was no bleeding noted in the mesosalpinx.  A similar process was carried out on the right fallopian tube allowing for bilateral tubal sterilization.   Good hemostasis was noted overall.  The instruments were then removed from the patient's abdomen and the fascial incision was repaired with 0 Vicryl, and the skin was closed with Dermabond.  The uterine manipulator and the tenaculum were removed from the vagina without complications. The patient tolerated the procedure well.  Sponge, lap, and needle counts were correct times two.  The patient was then taken to the recovery room awake, extubated and in stable condition.

## 2018-01-04 NOTE — OR Nursing (Signed)
Dr. Leafy Ro in to sign rx 1445

## 2018-01-07 ENCOUNTER — Encounter: Payer: Self-pay | Admitting: Obstetrics and Gynecology

## 2018-01-08 LAB — SURGICAL PATHOLOGY

## 2018-01-08 NOTE — Anesthesia Postprocedure Evaluation (Signed)
Anesthesia Post Note  Patient: Caitlin Young  Procedure(s) Performed: LAPAROSCOPIC TUBAL LIGATION BY PARTIAL SALPINGECTOMY (Bilateral )  Patient location during evaluation: PACU Anesthesia Type: General Level of consciousness: awake and alert and oriented Pain management: pain level controlled Vital Signs Assessment: post-procedure vital signs reviewed and stable Respiratory status: spontaneous breathing Cardiovascular status: blood pressure returned to baseline Anesthetic complications: no     Last Vitals:  Vitals:   01/04/18 1313 01/04/18 1448  BP: 130/90 121/70  Pulse: (!) 51 (!) 58  Resp: 18 18  Temp: 36.6 C   SpO2: 96% 100%    Last Pain:  Vitals:   01/07/18 0848  TempSrc:   PainSc: 5                  Dwana Garin

## 2018-04-16 ENCOUNTER — Ambulatory Visit
Admission: EM | Admit: 2018-04-16 | Discharge: 2018-04-16 | Disposition: A | Discharge status: Home / Self Care | Payer: Medicaid Other | Attending: Family Medicine | Admitting: Family Medicine

## 2018-04-16 ENCOUNTER — Encounter: Payer: Self-pay | Admitting: Emergency Medicine

## 2018-04-16 ENCOUNTER — Other Ambulatory Visit: Payer: Self-pay

## 2018-04-16 DIAGNOSIS — L02415 Cutaneous abscess of right lower limb: Secondary | ICD-10-CM

## 2018-04-16 MED ORDER — DOXYCYCLINE HYCLATE 100 MG PO CAPS: 100.0000 mg | ORAL_CAPSULE | Freq: Two times a day (BID) | ORAL | 0 refills | Status: DC

## 2018-04-16 MED ORDER — IBUPROFEN 800 MG PO TABS: 800.0000 mg | ORAL_TABLET | Freq: Three times a day (TID) | ORAL | 0 refills | Status: DC | PRN

## 2018-04-16 MED ORDER — LIDOCAINE-EPINEPHRINE-TETRACAINE (LET) SOLUTION
3.0000 mL | Freq: Once | NASAL | Status: AC
  Administered 2018-04-16: 3 mL via TOPICAL

## 2018-04-16 NOTE — Discharge Instructions (Signed)
Antibiotic as prescribed.  I'll see you on Thursday.  Take care  Dr. Lacinda Axon

## 2018-04-16 NOTE — ED Triage Notes (Signed)
Patient c/o boil to right groin area x 1 week. Painful to touch, denies fever.

## 2018-04-16 NOTE — ED Provider Notes (Signed)
[facility]    CSN: [id number] Arrival date & time: 04/16/18  1423  History   Chief Complaint Chief Complaint  Patient presents with  . Recurrent Skin Infections   HPI  27 year old female presents with a "boil".  Patient states that she had a boil of her right upper medial thigh for the past week.  Is now quite large.  Red.  Firm.  Exquisitely tender.  Very painful.  She reports that it severe.  She states that she is applied a warm compress without resolution.  No drainage.  No fevers or chills.  No other medications or interventions tried.  No other complaints.  Past Medical History:  Diagnosis Date  . Anemia   . Cervical cancer ([place])   . Headache   . PONV (postoperative nausea and vomiting)   . Pregnant   . Vaginal Pap smear, abnormal     Patient Active Problem List   Diagnosis Date Noted  . First trimester screening   . S/P cesarean section 04/09/2015  . Indication for care or intervention related to labor and delivery 04/06/2015  . Abdominal pain 03/16/2015  . Labor and delivery, indication for care 02/10/2015    Past Surgical History:  Procedure Laterality Date  . CESAREAN SECTION N/A 04/09/2015   Procedure: CESAREAN SECTION;  Surgeon: Benjaman Kindler, MD;  Location: [facility] ORS;  Service: Obstetrics;  Laterality: N/A;  . LAPAROSCOPIC TUBAL LIGATION Bilateral 01/04/2018   Procedure: LAPAROSCOPIC TUBAL LIGATION BY PARTIAL SALPINGECTOMY;  Surgeon: Benjaman Kindler, MD;  Location: [facility] ORS;  Service: Gynecology;  Laterality: Bilateral;  . LEEP      OB History    Gravida  3   Para  2   Term  2   Preterm  0   AB  1   Living  2     SAB  1   TAB  0   Ectopic  0   Multiple  0   Live Births  2            Home Medications    Prior to Admission medications   Medication Sig Start Date End Date Taking? Authorizing Provider  doxycycline (VIBRAMYCIN) 100 MG capsule Take 1 capsule (100 mg total) by mouth 2 (two) times daily. 04/16/18   Coral Spikes, DO  ibuprofen (ADVIL,MOTRIN) 800 MG tablet Take 1 tablet (800 mg total) by mouth every 8 (eight) hours as needed. 04/16/18   Coral Spikes, DO    Family History Family History  Problem Relation Age of Onset  . Diabetes Mother   . Hypertension Mother   . Diabetes Maternal Grandmother   . Cancer Maternal Grandmother        breast  . Hypertension Maternal Grandmother   . Asthma Maternal Grandmother     Social History Social History   Tobacco Use  . Smoking status: Former Smoker    Types: Cigarettes  . Smokeless tobacco: Never Used  . Tobacco comment: quit with +preg test  Substance Use Topics  . Alcohol use: Yes    Comment:    . Drug use: Yes    Types: Marijuana    Comment: + uds on 12-05-17     Allergies   Patient has no known allergies.   Review of Systems Review of Systems  Constitutional: Negative.   Skin:       Abscess.    Physical Exam Triage Vital Signs ED Triage Vitals  Enc Vitals Group     BP 04/16/18  1439 116/77     Pulse Rate 04/16/18 1439 84     Resp 04/16/18 1439 18     Temp 04/16/18 1439 98.6 F (37 C)     Temp Source 04/16/18 1439 Oral     SpO2 04/16/18 1439 100 %     Weight 04/16/18 1436 193 lb (87.5 kg)     Height 04/16/18 1436 5\' 7"  (1.702 m)     Head Circumference --      Peak Flow --      Pain Score 04/16/18 1436 8     Pain Loc --      Pain Edu? --      Excl. in [place]? --    Updated Vital Signs BP 116/77 (BP Location: Left Arm)   Pulse 84   Temp 98.6 F (37 C) (Oral)   Resp 18   Ht 5\' 7"  (1.702 m)   Wt 193 lb (87.5 kg)   LMP 04/14/2018 (Exact Date)   SpO2 100%   Breastfeeding? No   BMI 30.23 kg/m   Visual Acuity Right Eye Distance:   Left Eye Distance:   Bilateral Distance:    Right Eye Near:   Left Eye Near:    Bilateral Near:     Physical Exam  Constitutional: She is oriented to person, place, and time. She appears well-developed. No distress.  HENT:  Head: Normocephalic and atraumatic.  Eyes:  Conjunctivae are normal. No scleral icterus.  Pulmonary/Chest: Effort normal. No respiratory distress.  Neurological: She is alert and oriented to person, place, and time.  Skin:  Right upper medial thigh -large 5 x 6.5 cm abscess. Erythematous. Fluctuant.  Psychiatric: She has a normal mood and affect. Her behavior is normal.  Nursing note and vitals reviewed.  UC Treatments / Results  Labs (all labs ordered are listed, but only abnormal results are displayed) Labs Reviewed - No data to display  EKG None  Radiology No results found.  Procedures Incision and Drainage Date/Time: 04/16/2018 4:38 PM Performed by: Coral Spikes, DO Authorized by: Coral Spikes, DO   Consent:    Consent obtained:  Verbal   Consent given by:  Patient Location:    Type:  Abscess   Location:  Lower extremity   Lower extremity location:  Leg   Leg location:  R upper leg Pre-procedure details:    Skin preparation:  Antiseptic wash Anesthesia (see MAR for exact dosages):    Anesthesia method:  Topical application and local infiltration   Local anesthetic:  Lidocaine 1% w/o epi Procedure type:    Complexity:  Simple Procedure details:    Needle aspiration: no     Incision types:  Stab incision   Scalpel blade:  11   Wound management:  Probed and deloculated   Drainage:  Bloody and purulent   Drainage amount:  Moderate   Wound treatment:  Wound left open   Packing materials:  1/2 in iodoform gauze Post-procedure details:    Patient tolerance of procedure:  Tolerated well, no immediate complications   (including critical care time)  Medications Ordered in UC Medications  lidocaine-EPINEPHrine-tetracaine (LET) solution (3 mLs Topical Given 04/16/18 1513)    Initial Impression / Assessment and Plan / UC Course  I have reviewed the triage vital signs and the nursing notes.  Pertinent labs & imaging results that were available during my care of the patient were reviewed by me and considered  in my medical decision making (see chart for details).  27 year old female presents with abscess.  Incision and drainage performed today.  Placed on doxycycline.  Ibuprofen for pain.  Return in 2 days for packing removal.  Final Clinical Impressions(s) / UC Diagnoses   Final diagnoses:  Cutaneous abscess of right lower extremity     Discharge Instructions     Antibiotic as prescribed.  I'll see you on Thursday.  Take care  Dr. Lacinda Axon     ED Prescriptions    Medication Sig Dispense Auth. Provider   ibuprofen (ADVIL,MOTRIN) 800 MG tablet Take 1 tablet (800 mg total) by mouth every 8 (eight) hours as needed. 30 tablet Tysen Roesler G, DO   doxycycline (VIBRAMYCIN) 100 MG capsule Take 1 capsule (100 mg total) by mouth 2 (two) times daily. 14 capsule Coral Spikes, DO     Controlled Substance Prescriptions [place] Controlled Substance Registry consulted? Not Applicable   Coral Spikes, DO 04/16/18 1639

## 2018-11-19 IMAGING — US US OB COMP LESS 14 WK
1 series · 15 of 28 positions shown · non-contrast
Comparison: None.

CLINICAL DATA: Vaginal bleeding. Quantitative beta HCG on
based on LMP.

EXAM:
OBSTETRIC <14 WK US AND TRANSVAGINAL OB US
TECHNIQUE: Both transabdominal and transvaginal ultrasound examinations were
performed for complete evaluation of the gestation as well as the
maternal uterus, adnexal regions, and pelvic cul-de-sac.
Transvaginal technique was performed to assess early pregnancy.

[Series 1: us ob comp less 14 wk · 83 acquisitions, 15 frames shown]
[im 1/83]
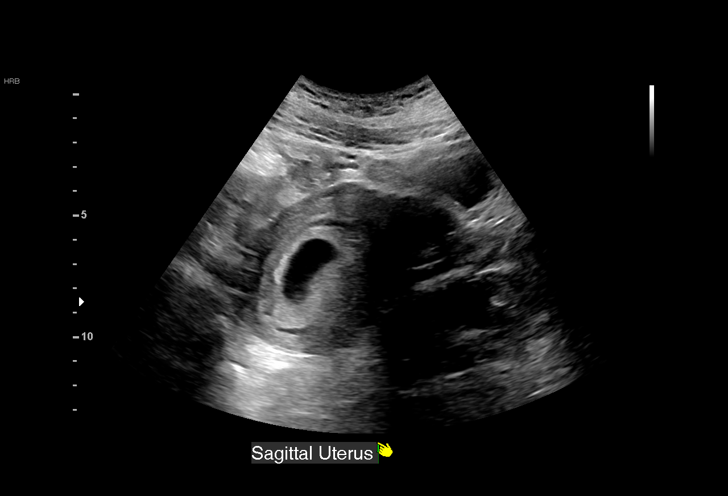
[im 7/83]
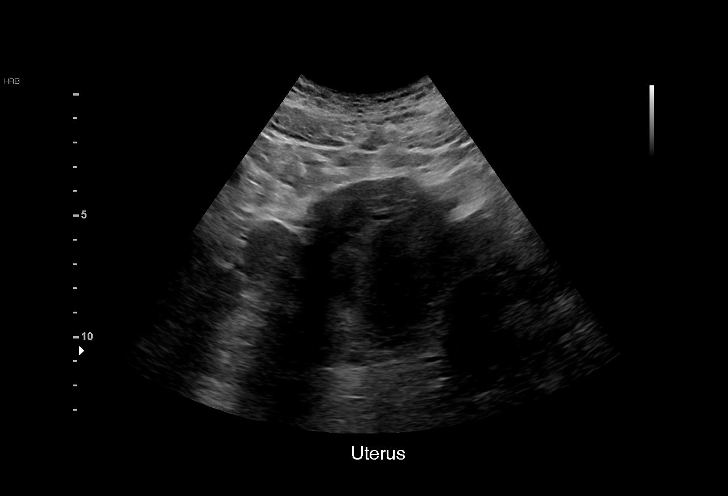
[im 13/83]
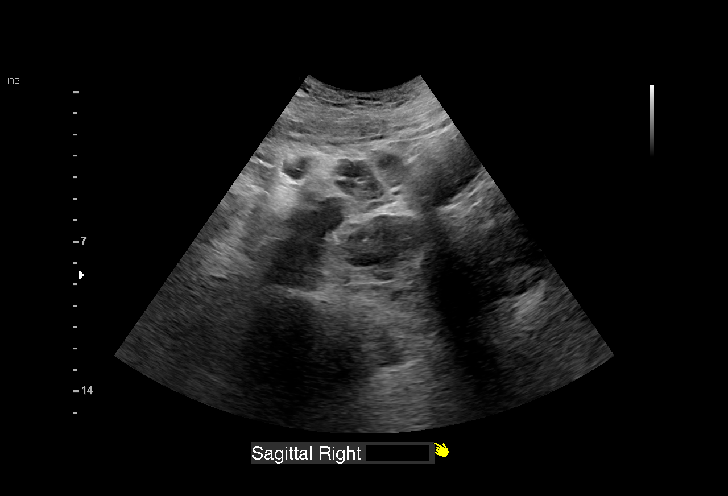
[im 19/83]
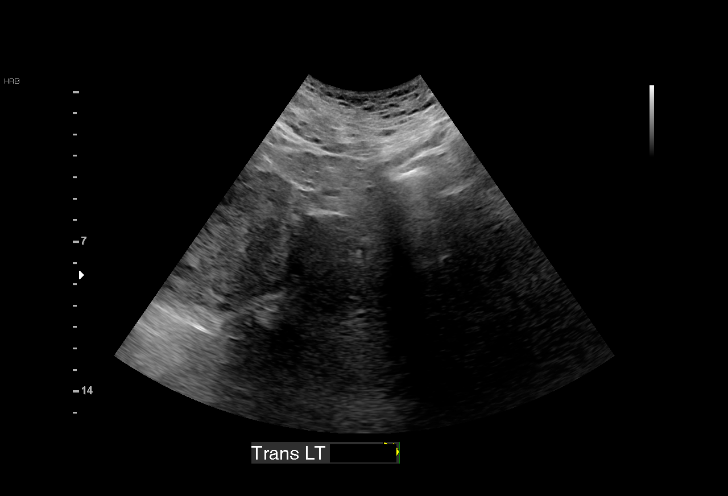
[im 25/83]
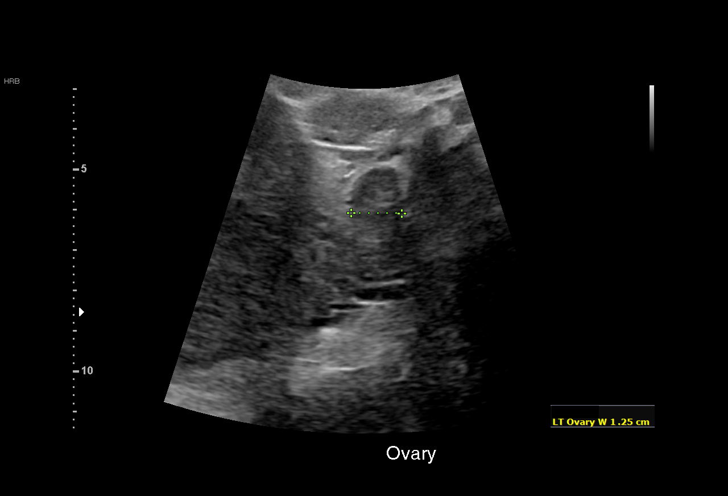
[im 31/83]
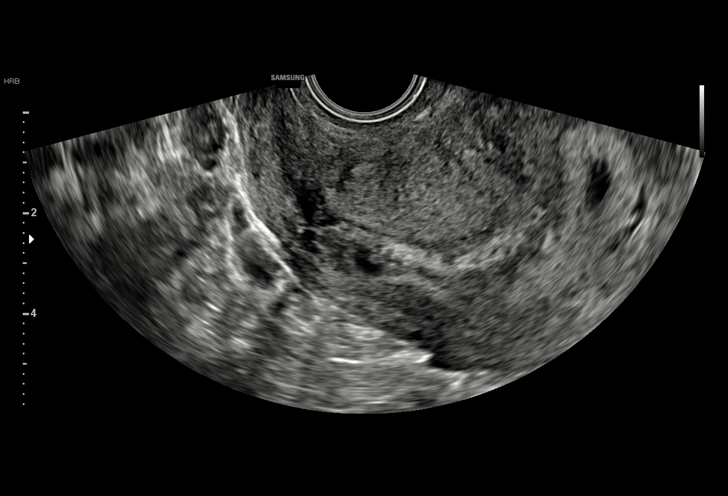
[im 37/83]
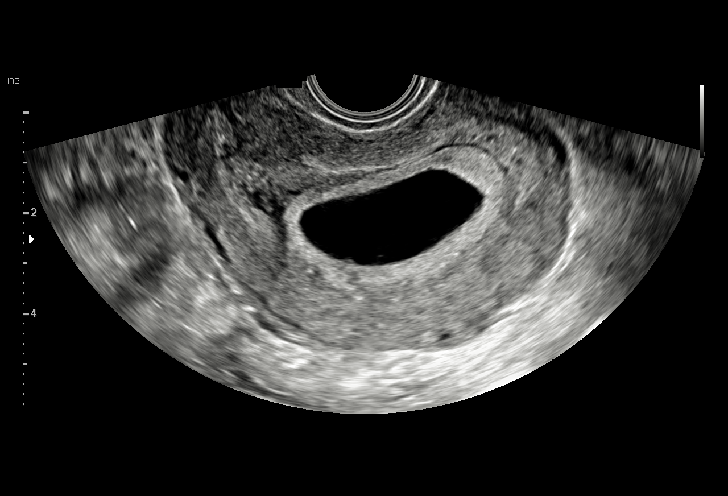
[im 43/83]
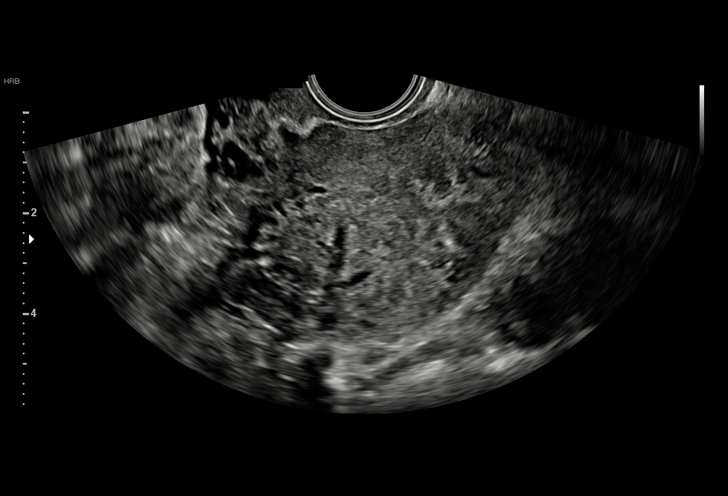
[im 46/83]
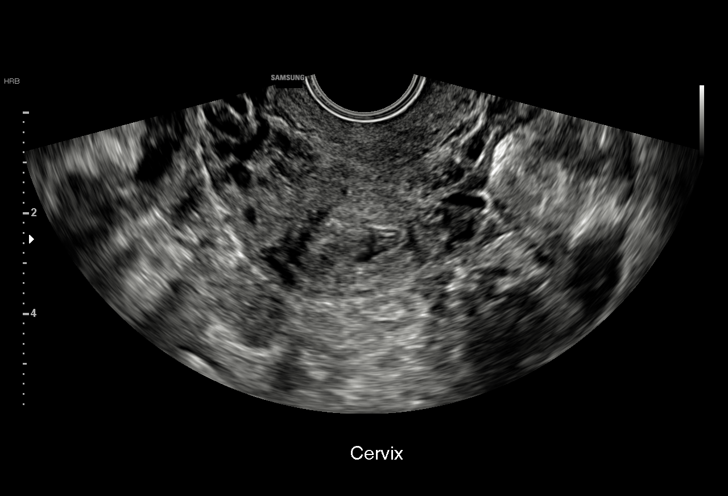
[im 52/83]
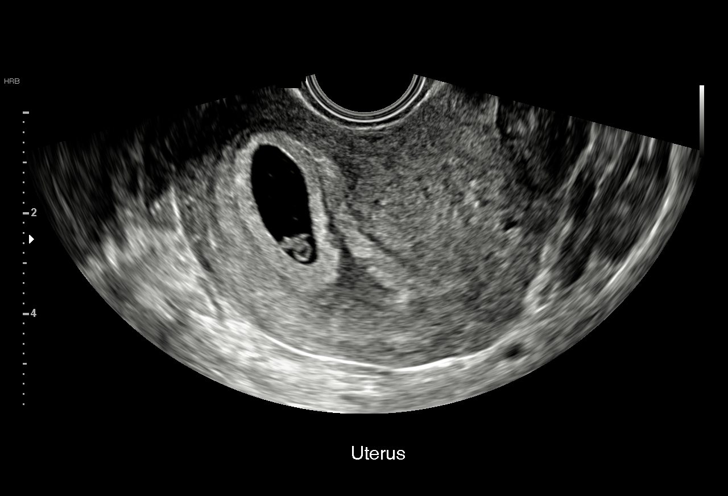
[im 58/83]
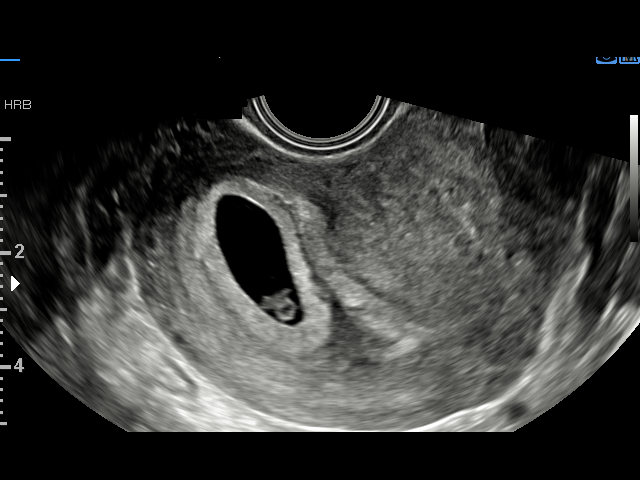
[im 64/83]
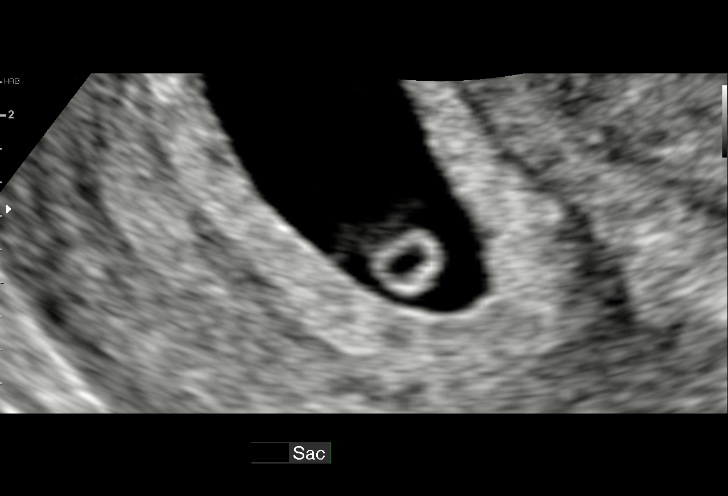
[im 70/83]
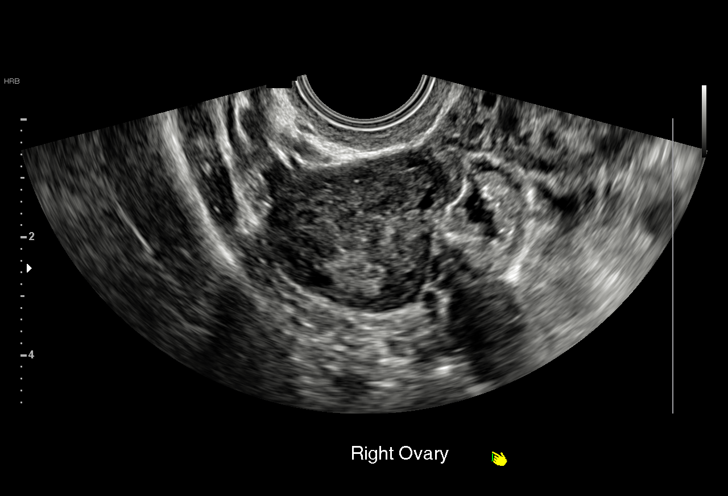
[im 76/83]
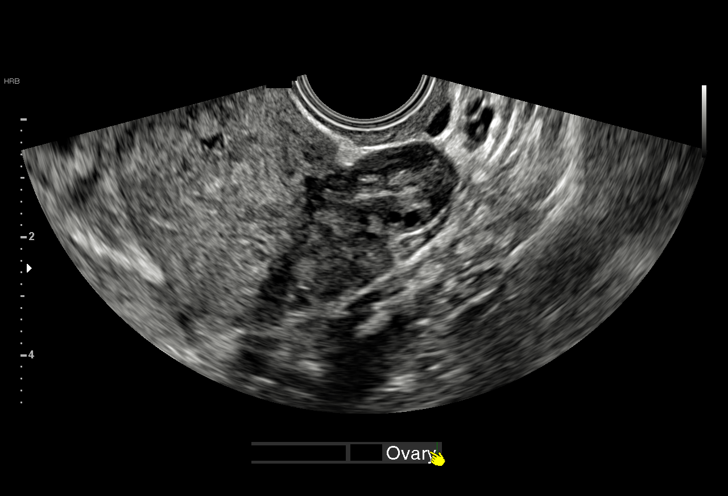
[im 83/83]
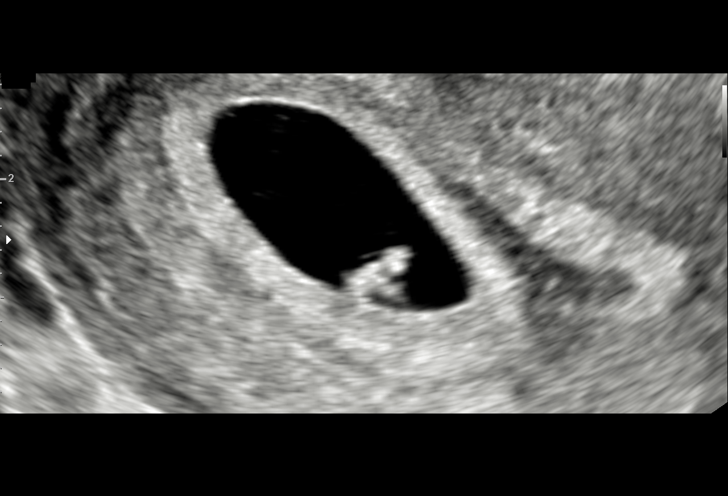

[15 of 28 positions shown; findings below may reference images not displayed]

FINDINGS: Intrauterine gestational sac: Present

Yolk sac:  Present

Embryo:  Present

Cardiac Activity: Present

Heart Rate: 114  bpm

CRL:  6.8  mm   6 w   3 d                  US EDC: 12/08/2017

Subchorionic hemorrhage:  Small subchorionic hemorrhage is present.

Maternal uterus/adnexae: Normal appearance of both ovaries. No free
pelvic fluid.
IMPRESSION: 1. Single living intrauterine embryo measuring 6 weeks 3 days.
2. Today's exam confirms clinical EDC of 12/03/2017.

## 2019-05-27 ENCOUNTER — Ambulatory Visit
Admission: EM | Admit: 2019-05-27 | Discharge: 2019-05-27 | Disposition: A | Discharge status: Home / Self Care | Payer: Medicaid Other | Attending: Family Medicine | Admitting: Family Medicine

## 2019-05-27 ENCOUNTER — Encounter: Payer: Self-pay | Admitting: Emergency Medicine

## 2019-05-27 ENCOUNTER — Other Ambulatory Visit: Payer: Self-pay

## 2019-05-27 DIAGNOSIS — S91332A Puncture wound without foreign body, left foot, initial encounter: Secondary | ICD-10-CM | POA: Diagnosis not present

## 2019-05-27 DIAGNOSIS — Z23 Encounter for immunization: Secondary | ICD-10-CM

## 2019-05-27 DIAGNOSIS — W450XXA Nail entering through skin, initial encounter: Secondary | ICD-10-CM | POA: Diagnosis not present

## 2019-05-27 MED ORDER — LEVOFLOXACIN 750 MG PO TABS: 750.0000 mg | ORAL_TABLET | Freq: Every day | ORAL | 0 refills | Status: AC

## 2019-05-27 MED ORDER — TETANUS-DIPHTH-ACELL PERTUSSIS 5-2.5-18.5 LF-MCG/0.5 IM SUSP
0.5000 mL | Freq: Once | INTRAMUSCULAR | Status: AC
  Administered 2019-05-27: 0.5 mL via INTRAMUSCULAR

## 2019-05-27 NOTE — ED Triage Notes (Signed)
Patient stepped on a rusty nail today with her left foot. She stated she did have shoes on and it went through her shoe. Small puncture wound to left heel.

## 2019-05-27 NOTE — ED Provider Notes (Signed)
[place]   Name: Caitlin Young DOB: 25-Oct-1990 MRN: [id number] CSN: [id number] PCP: Patient, No Pcp Per  Arrival date and time:  05/27/19 1316  Chief Complaint:  Puncture Wound   NOTE: Prior to seeing the patient today, I have reviewed the triage nursing documentation and vital signs. Clinical staff has updated patient's PMH/PSHx, current medication list, and drug allergies/intolerances to ensure comprehensive history available to assist in medical decision making.   History:   HPI: Caitlin Young is a 28 y.o. female who presents today with complaints of pain to the plantar aspect of her LEFT foot. Patient notes that she stepped on a "dirty rusty nail" approximately 1 hour PTA. Nail passed through her shoe. She denies any associated bleeding. Puncture site is tender to touch. Tetanus vaccination status reviewed with patient. Based on her reports, it is determined that she is not up to date on her tetanus prophylaxis. Due to the nature of her injury, the patient will need to have a tetanus injection today while in clinic today.   Past Medical History:  Diagnosis Date   Anemia    Cervical cancer ([facility])    Headache    PONV (postoperative nausea and vomiting)    Pregnant    Vaginal Pap smear, abnormal     Past Surgical History:  Procedure Laterality Date   CESAREAN SECTION N/A 04/09/2015   Procedure: CESAREAN SECTION;  Surgeon: Benjaman Kindler, MD;  Location: [facility] ORS;  Service: Obstetrics;  Laterality: N/A;   LAPAROSCOPIC TUBAL LIGATION Bilateral 01/04/2018   Procedure: LAPAROSCOPIC TUBAL LIGATION BY PARTIAL SALPINGECTOMY;  Surgeon: Benjaman Kindler, MD;  Location: [facility] ORS;  Service: Gynecology;  Laterality: Bilateral;   LEEP      Family History  Problem Relation Age of Onset   Diabetes Mother    Hypertension Mother    Diabetes Maternal Grandmother    Cancer Maternal Grandmother        breast   Hypertension Maternal Grandmother    Asthma Maternal  Grandmother     Social History   Tobacco Use   Smoking status: Former Smoker    Types: Cigarettes   Smokeless tobacco: Never Used   Tobacco comment: quit with +preg test  Substance Use Topics   Alcohol use: Yes    Comment:     Drug use: Yes    Types: Marijuana    Comment: + uds on 12-05-17    Patient Active Problem List   Diagnosis Date Noted   First trimester screening    S/P cesarean section 04/09/2015   Indication for care or intervention related to labor and delivery 04/06/2015   Abdominal pain 03/16/2015   Labor and delivery, indication for care 02/10/2015    Home Medications:    No outpatient medications have been marked as taking for the 05/27/19 encounter [facility] Encounter).    Allergies:   Patient has no known allergies.  Review of Systems (ROS): Review of Systems  Constitutional: Negative for chills and fever.  Respiratory: Negative for cough and shortness of breath.   Cardiovascular: Negative for chest pain and palpitations.  Gastrointestinal: Negative for nausea and vomiting.  Skin: Positive for color change and wound (LEFT foot).  Neurological: Negative for dizziness, syncope, weakness and headaches.  All other systems reviewed and are negative.    Vital Signs: Today's Vitals   05/27/19 1327 05/27/19 1329 05/27/19 1405  BP:  105/66   Pulse:  96   Resp:  18   Temp:  98.6 F (37  C)   TempSrc:  Oral   SpO2:  99%   Weight: 198 lb (89.8 kg)    Height: 5\' 7"  (1.702 m)    PainSc: 5   5     Physical Exam: Physical Exam  Constitutional: She is oriented to person, place, and time and well-developed, well-nourished, and in no distress.  HENT:  Head: Normocephalic and atraumatic.  Mouth/Throat: Mucous membranes are normal.  Eyes: Pupils are equal, round, and reactive to light.  Cardiovascular: Normal rate, regular rhythm, normal heart sounds and intact distal pulses. Exam reveals no gallop and no friction rub.  No murmur  heard. Pulmonary/Chest: Effort normal and breath sounds normal. No respiratory distress. She has no wheezes. She has no rales.  Musculoskeletal:     Left foot: Normal range of motion. Tenderness present. No swelling or laceration.       Feet:  Neurological: She is alert and oriented to person, place, and time. Gait normal.  Skin: Skin is warm and dry. No rash noted.  Psychiatric: Mood, memory, affect and judgment normal.  Nursing note and vitals reviewed.   Urgent Care Treatments / Results:   LABS: PLEASE NOTE: all labs that were ordered this encounter are listed, however only abnormal results are displayed. Labs Reviewed - No data to display  EKG: -None  RADIOLOGY: No results found.  PROCEDURES: Procedures  MEDICATIONS RECEIVED THIS VISIT: Medications  Tdap (BOOSTRIX) injection 0.5 mL (0.5 mLs Intramuscular Given 05/27/19 1341)    PERTINENT CLINICAL COURSE NOTES/UPDATES:   Initial Impression / Assessment and Plan / Urgent Care Course:  Pertinent labs & imaging results that were available during my care of the patient were personally reviewed by me and considered in my medical decision making (see lab/imaging section of note for values and interpretations).  Caitlin Young is a 28 y.o. female who presents to [facility] today with complaints of Puncture Wound   Patient is well appearing overall in clinic today. She does not appear to be in any acute distress. Presenting symptoms (see HPI) and exam as documented above. Given that puncture was caused by a contaminated metal object (rusty nail), TDAP vaccination status was updated today in clinic. Discussed need for prophylactic antimicrobial coverage to prevent foot infection. Puncture occurred despite shoes, thus will need to cover for pseudomonas. Will send in a prescription for a 5 day course of levofloxacin to be taken daily.  She was encouraged to soak her foot in warm [place]pson salt water BID while on antibiotics.  Patient to monitor for signs and symptoms of infection, which would include increased redness, swelling, streaking, drainage, pain, and the development of a fever.  Discussed follow up with primary care physician in 1 week for re-evaluation. I have reviewed the follow up and strict return precautions for any new or worsening symptoms. Patient is aware of symptoms that would be deemed urgent/emergent, and would thus require further evaluation either here or in the emergency department. At the time of discharge, she verbalized understanding and consent with the discharge plan as it was reviewed with her. All questions were fielded by provider and/or clinic staff prior to patient discharge.    Final Clinical Impressions / Urgent Care Diagnoses:   Final diagnoses:  Puncture wound of plantar aspect of foot, left, initial encounter    New Prescriptions:  [place] Controlled Substance Registry consulted? Not Applicable  Meds ordered this encounter  Medications   Tdap (BOOSTRIX) injection 0.5 mL   levofloxacin (LEVAQUIN) 750 MG tablet  Sig: Take 1 tablet (750 mg total) by mouth daily for 5 days.    Dispense:  5 tablet    Refill:  0    Recommended Follow up Care:  Patient encouraged to follow up with the following provider within the specified time frame, or sooner as dictated by the severity of her symptoms. As always, she was instructed that for any urgent/emergent care needs, she should seek care either here or in the emergency department for more immediate evaluation.  Follow-up Information    PCP In 1 week.   Why: General reassessment of symptoms if not improving        NOTE: This note was prepared using Lobbyist along with smaller Company secretary. Despite my best ability to proofread, there is the potential that transcriptional errors may still occur from this process, and are completely unintentional.     Karen Kitchens, NP 05/27/19 1534

## 2019-05-27 NOTE — Discharge Instructions (Signed)
It was very nice seeing you today in clinic. Thank you for entrusting me with your care.   Please utilize the medications that we discussed. Your prescriptions have been called in to your pharmacy.   Soak foot TWICE a day in warm [place] salt water.   Make arrangements to follow up with your regular doctor in 1 week for re-evaluation if not improving. If your symptoms/condition worsens, please seek follow up care either here or in the ER. Please remember, our [place] providers are "right here with you" when you need [place].   Again, it was my pleasure to take care of you today. Thank you for choosing our clinic. I hope that you start to feel better quickly.   Honor Loh, MSN, APRN, FNP-C, CEN Advanced Practice Provider [facility]

## 2019-12-03 DIAGNOSIS — E663 Overweight: Secondary | ICD-10-CM

## 2019-12-04 ENCOUNTER — Ambulatory Visit: Payer: Medicaid Other | Admitting: Physician Assistant

## 2019-12-04 ENCOUNTER — Encounter: Payer: Self-pay | Admitting: Physician Assistant

## 2019-12-04 ENCOUNTER — Other Ambulatory Visit: Payer: Self-pay

## 2019-12-04 DIAGNOSIS — Z113 Encounter for screening for infections with a predominantly sexual mode of transmission: Secondary | ICD-10-CM

## 2019-12-04 LAB — WET PREP FOR TRICH, YEAST, CLUE
Trichomonas Exam: NEGATIVE
Yeast Exam: NEGATIVE
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
## 2019-12-04 NOTE — Progress Notes (Signed)
Wet prep reviewed-no Tx indicated Debera Lat, RN

## 2019-12-04 NOTE — Progress Notes (Signed)
In for screening; had BTL done- primary care list given for Pap follow-up; desires HIV/RPR testing Debera Lat, RN

## 2020-03-15 ENCOUNTER — Ambulatory Visit: Payer: Medicaid Other

## 2020-03-28 ENCOUNTER — Other Ambulatory Visit: Payer: Self-pay

## 2020-03-28 ENCOUNTER — Ambulatory Visit
Admission: EM | Admit: 2020-03-28 | Discharge: 2020-03-28 | Disposition: A | Discharge status: Home / Self Care | Payer: Medicaid Other | Attending: Internal Medicine | Admitting: Internal Medicine

## 2020-03-28 ENCOUNTER — Encounter: Payer: Self-pay | Admitting: Emergency Medicine

## 2020-03-28 DIAGNOSIS — S0181XA Laceration without foreign body of other part of head, initial encounter: Secondary | ICD-10-CM

## 2020-03-28 DIAGNOSIS — S0083XA Contusion of other part of head, initial encounter: Secondary | ICD-10-CM | POA: Diagnosis not present

## 2020-03-28 MED ORDER — ACETAMINOPHEN 500 MG PO TABS
1000.0000 mg | ORAL_TABLET | Freq: Once | ORAL | Status: AC
  Administered 2020-03-28: 1000 mg via ORAL

## 2020-03-28 MED ORDER — CEPHALEXIN 500 MG PO CAPS: ORAL_CAPSULE | ORAL | 0 refills | Status: DC
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
## 2020-03-28 NOTE — Discharge Instructions (Signed)
Keep the steri strips dry for 7 days, then after that is Ok to take quick showers and get them wet. They will fall off on their own Watch out for signs of infection like worse pain, redness or oozing, if this happens you need to be seen.

## 2020-03-28 NOTE — ED Triage Notes (Signed)
Patient states that she was roller skating yesterday and fell and hit the left side of her eyebrow on the floor.

## 2020-04-05 ENCOUNTER — Ambulatory Visit: Payer: Medicaid Other

## 2020-04-06 ENCOUNTER — Encounter: Payer: Self-pay | Admitting: Family Medicine

## 2020-04-06 ENCOUNTER — Other Ambulatory Visit: Payer: Self-pay

## 2020-04-06 ENCOUNTER — Ambulatory Visit: Payer: Medicaid Other | Admitting: Family Medicine

## 2020-04-06 DIAGNOSIS — Z113 Encounter for screening for infections with a predominantly sexual mode of transmission: Secondary | ICD-10-CM | POA: Diagnosis not present

## 2020-04-06 LAB — WET PREP FOR TRICH, YEAST, CLUE
Trichomonas Exam: NEGATIVE
Yeast Exam: NEGATIVE

## 2020-04-06 NOTE — Progress Notes (Signed)
Wet mount reviewed, no treatment indicated..Alanie Syler Brewer-Jensen, RN 
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
## 2020-11-12 ENCOUNTER — Other Ambulatory Visit: Payer: Self-pay

## 2020-11-12 ENCOUNTER — Encounter: Payer: Self-pay | Admitting: Emergency Medicine

## 2020-11-12 ENCOUNTER — Ambulatory Visit
Admission: EM | Admit: 2020-11-12 | Discharge: 2020-11-12 | Disposition: A | Discharge status: Home / Self Care | Payer: Medicaid Other | Attending: Internal Medicine | Admitting: Internal Medicine

## 2020-11-12 DIAGNOSIS — R059 Cough, unspecified: Secondary | ICD-10-CM | POA: Diagnosis not present

## 2020-11-12 DIAGNOSIS — Z880 Allergy status to penicillin: Secondary | ICD-10-CM | POA: Diagnosis not present

## 2020-11-12 DIAGNOSIS — Z87891 Personal history of nicotine dependence: Secondary | ICD-10-CM | POA: Insufficient documentation

## 2020-11-12 DIAGNOSIS — R519 Headache, unspecified: Secondary | ICD-10-CM | POA: Diagnosis present

## 2020-11-12 DIAGNOSIS — Z20822 Contact with and (suspected) exposure to covid-19: Secondary | ICD-10-CM | POA: Diagnosis not present

## 2020-11-12 DIAGNOSIS — G43109 Migraine with aura, not intractable, without status migrainosus: Secondary | ICD-10-CM | POA: Diagnosis not present

## 2020-11-12 LAB — URINALYSIS, COMPLETE (UACMP) WITH MICROSCOPIC
Bacteria, UA: NONE SEEN
Bilirubin Urine: NEGATIVE
Glucose, UA: NEGATIVE mg/dL
Hgb urine dipstick: NEGATIVE
Ketones, ur: NEGATIVE mg/dL
Leukocytes,Ua: NEGATIVE
Nitrite: NEGATIVE
Protein, ur: NEGATIVE mg/dL
Specific Gravity, Urine: 1.025 (ref 1.005–1.030)
pH: 7 (ref 5.0–8.0)

## 2020-11-12 MED ORDER — SUMATRIPTAN SUCCINATE 6 MG/0.5ML ~~LOC~~ SOLN
6.0000 mg | Freq: Once | SUBCUTANEOUS | Status: AC
Start: 2020-11-12 — End: 2020-11-12
  Administered 2020-11-12: 6 mg via SUBCUTANEOUS

## 2020-11-12 MED ORDER — METOCLOPRAMIDE HCL 5 MG/ML IJ SOLN
5.0000 mg | Freq: Once | INTRAMUSCULAR | Status: AC
Start: 2020-11-12 — End: 2020-11-12
  Administered 2020-11-12: 5 mg via INTRAMUSCULAR

## 2020-11-12 MED ORDER — KETOROLAC TROMETHAMINE 30 MG/ML IJ SOLN
30.0000 mg | Freq: Once | INTRAMUSCULAR | Status: AC
  Administered 2020-11-12: 30 mg via INTRAMUSCULAR

## 2020-11-12 MED ORDER — SUMATRIPTAN SUCCINATE 25 MG PO TABS: 25.0000 mg | ORAL_TABLET | Freq: Once | ORAL | 0 refills | Status: AC

## 2020-11-12 NOTE — ED Triage Notes (Signed)
Patient c/o headache and slight cough that started yesterday.  Patient states that she vomited once yesterday.  Patient denies fevers.  Patient reports some pain in her lower back.
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
## 2020-11-12 NOTE — Discharge Instructions (Addendum)
Please take medications as directed If symptoms worsen please return to urgent care

## 2020-11-13 LAB — SARS CORONAVIRUS 2 (TAT 6-24 HRS): SARS Coronavirus 2: NEGATIVE

## 2022-05-31 ENCOUNTER — Emergency Department: Payer: Medicaid Other

## 2022-05-31 ENCOUNTER — Encounter: Payer: Self-pay | Admitting: Emergency Medicine

## 2022-05-31 DIAGNOSIS — U071 COVID-19: Secondary | ICD-10-CM | POA: Diagnosis not present

## 2022-05-31 DIAGNOSIS — R509 Fever, unspecified: Secondary | ICD-10-CM | POA: Diagnosis present

## 2022-05-31 DIAGNOSIS — Z85828 Personal history of other malignant neoplasm of skin: Secondary | ICD-10-CM | POA: Insufficient documentation

## 2022-05-31 LAB — CBC WITH DIFFERENTIAL/PLATELET
Abs Immature Granulocytes: 0.03 10*3/uL (ref 0.00–0.07)
Basophils Absolute: 0 10*3/uL (ref 0.0–0.1)
Basophils Relative: 0 %
Eosinophils Absolute: 0.2 10*3/uL (ref 0.0–0.5)
Eosinophils Relative: 3 %
HCT: 37.2 % (ref 36.0–46.0)
Hemoglobin: 12.4 g/dL (ref 12.0–15.0)
Immature Granulocytes: 0 %
Lymphocytes Relative: 5 %
Lymphs Abs: 0.4 10*3/uL — ABNORMAL LOW (ref 0.7–4.0)
MCH: 31.1 pg (ref 26.0–34.0)
MCHC: 33.3 g/dL (ref 30.0–36.0)
MCV: 93.2 fL (ref 80.0–100.0)
Monocytes Absolute: 0.5 10*3/uL (ref 0.1–1.0)
Monocytes Relative: 6 %
Neutro Abs: 6.4 10*3/uL (ref 1.7–7.7)
Neutrophils Relative %: 86 %
Platelets: 248 10*3/uL (ref 150–400)
RBC: 3.99 MIL/uL (ref 3.87–5.11)
RDW: 12.1 % (ref 11.5–15.5)
WBC: 7.6 10*3/uL (ref 4.0–10.5)
nRBC: 0 % (ref 0.0–0.2)

## 2022-05-31 LAB — BASIC METABOLIC PANEL
Anion gap: 7 (ref 5–15)
BUN: 12 mg/dL (ref 6–20)
CO2: 19 mmol/L — ABNORMAL LOW (ref 22–32)
Calcium: 9.4 mg/dL (ref 8.9–10.3)
Chloride: 111 mmol/L (ref 98–111)
Creatinine, Ser: 0.67 mg/dL (ref 0.44–1.00)
GFR, Estimated: >60 mL/min (ref >60)
Glucose, Bld: 95 mg/dL (ref 70–99)
Potassium: 3.7 mmol/L (ref 3.5–5.1)
Sodium: 137 mmol/L (ref 135–145)

## 2022-05-31 LAB — HEPATIC FUNCTION PANEL
ALT: 13 U/L (ref 0–44)
AST: 13 U/L — ABNORMAL LOW (ref 15–41)
Albumin: 4.1 g/dL (ref 3.5–5.0)
Alkaline Phosphatase: 39 U/L (ref 38–126)
Bilirubin, Direct: <0.1 mg/dL (ref 0.0–0.2)
Total Bilirubin: 0.6 mg/dL (ref 0.3–1.2)
Total Protein: 7.3 g/dL (ref 6.5–8.1)

## 2022-05-31 LAB — TROPONIN I (HIGH SENSITIVITY): Troponin I (High Sensitivity): 3 ng/L (ref <18)

## 2022-05-31 MED ORDER — IBUPROFEN 100 MG/5ML PO SUSP
600.0000 mg | Freq: Once | ORAL | Status: DC
  Filled 2022-05-31: qty 30

## 2022-05-31 NOTE — ED Triage Notes (Signed)
Pt c/o generalized back pain that extends around her shoulder blades into sides of chest, accompanied by SOB and nausea that started suddenly today at 1630. Pt with fever of 100.24F but denies recent cough, congestion or urinary symptoms. Pt took Flexeril '10mg'$  prior to arrival and last had tylenol at 1700.

## 2022-05-31 NOTE — ED Provider Triage Note (Signed)
Emergency Medicine Provider Triage Evaluation Note  Caitlin Young , a 31 y.o. female  was evaluated in triage.  Pt complains of low back pain to her shoulders. Began today around 430pm. Reports that is started in her shoulders and armpits and then spread throughout her back. She reports that she wiped the counter down and then it started. Radiates into her chest. No SOB.  Review of Systems  Positive: Back pain, sometimes CP and abd pain Negative: fever  Physical Exam  There were no vitals taken for this visit. Gen:   Awake, no distress   Resp:  Normal effort  MSK:   Moves extremities without difficulty  Other:    Medical Decision Making  Medically screening exam initiated at 10:53 PM.  Appropriate orders placed.  Caitlin Young was informed that the remainder of the evaluation will be completed by another provider, this initial triage assessment does not replace that evaluation, and the importance of remaining in the ED until their evaluation is complete.     Marquette Old, PA-C 05/31/22 2257

## 2022-06-01 ENCOUNTER — Emergency Department
Admission: EM | Admit: 2022-06-01 | Discharge: 2022-06-01 | Disposition: A | Discharge status: Home / Self Care | Payer: Medicaid Other | Attending: Emergency Medicine | Admitting: Emergency Medicine

## 2022-06-01 ENCOUNTER — Emergency Department: Payer: Medicaid Other

## 2022-06-01 DIAGNOSIS — U071 COVID-19: Secondary | ICD-10-CM

## 2022-06-01 LAB — RESP PANEL BY RT-PCR (FLU A&B, COVID) ARPGX2
Influenza A by PCR: NEGATIVE
Influenza B by PCR: NEGATIVE
SARS Coronavirus 2 by RT PCR: POSITIVE — AB

## 2022-06-01 LAB — URINALYSIS, ROUTINE W REFLEX MICROSCOPIC
Bilirubin Urine: NEGATIVE
Glucose, UA: NEGATIVE mg/dL
Hgb urine dipstick: NEGATIVE
Ketones, ur: NEGATIVE mg/dL
Leukocytes,Ua: NEGATIVE
Nitrite: NEGATIVE
Protein, ur: NEGATIVE mg/dL
Specific Gravity, Urine: 1.026 (ref 1.005–1.030)
pH: 5 (ref 5.0–8.0)

## 2022-06-01 LAB — PROCALCITONIN: Procalcitonin: 0.12 ng/mL

## 2022-06-01 LAB — TROPONIN I (HIGH SENSITIVITY): Troponin I (High Sensitivity): 3 ng/L (ref <18)

## 2022-06-01 LAB — POC URINE PREG, ED: Preg Test, Ur: NEGATIVE

## 2022-06-01 MED ORDER — IOHEXOL 350 MG/ML SOLN
75.0000 mL | Freq: Once | INTRAVENOUS | Status: AC | PRN
  Administered 2022-06-01: 75 mL via INTRAVENOUS

## 2022-06-01 MED ORDER — KETOROLAC TROMETHAMINE 30 MG/ML IJ SOLN
30.0000 mg | Freq: Once | INTRAMUSCULAR | Status: AC
  Administered 2022-06-01: 30 mg via INTRAVENOUS
  Filled 2022-06-01: qty 1

## 2022-06-01 MED ORDER — ACETAMINOPHEN 500 MG PO TABS
1000.0000 mg | ORAL_TABLET | Freq: Once | ORAL | Status: AC
Start: 2022-06-01 — End: 2022-06-01
  Administered 2022-06-01: 1000 mg via ORAL
  Filled 2022-06-01: qty 2

## 2022-06-01 MED ORDER — IBUPROFEN 600 MG PO TABS
600.0000 mg | ORAL_TABLET | Freq: Once | ORAL | Status: AC
  Administered 2022-06-01: 600 mg via ORAL

## 2022-06-01 MED ORDER — NIRMATRELVIR/RITONAVIR (PAXLOVID)TABLET: 3.0000 | ORAL_TABLET | Freq: Two times a day (BID) | ORAL | 0 refills | Status: AC

## 2022-06-01 NOTE — ED Notes (Signed)
Pt to CT

## 2022-06-01 NOTE — ED Provider Notes (Signed)
[facility] Provider Note    Event Date/Time   First MD Initiated Contact with Patient 06/01/22 [phone]     (approximate)   History   Back Pain   HPI  Caitlin Young is a 31 y.o. female with history of anemia who presents emergency department with upper back pain, myalgias, fever that started tonight.  States she is feeling short of breath.  No cough or congestion.  No vomiting or diarrhea.  No dysuria.  Unvaccinated against COVID-19.   History provided by patient and mother.    Past Medical History:  Diagnosis Date   Anemia    Cervical cancer (HCC)    Headache    PONV (postoperative nausea and vomiting)    Pregnant    Vaginal Pap smear, abnormal     Past Surgical History:  Procedure Laterality Date   CESAREAN SECTION N/A 04/09/2015   Procedure: CESAREAN SECTION;  Surgeon: Benjaman Kindler, MD;  Location: [facility] ORS;  Service: Obstetrics;  Laterality: N/A;   LAPAROSCOPIC TUBAL LIGATION Bilateral 01/04/2018   Procedure: LAPAROSCOPIC TUBAL LIGATION BY PARTIAL SALPINGECTOMY;  Surgeon: Benjaman Kindler, MD;  Location: [facility] ORS;  Service: Gynecology;  Laterality: Bilateral;   LEEP      MEDICATIONS:  Prior to Admission medications   Medication Sig Start Date End Date Taking? Authorizing Provider  SUMAtriptan (IMITREX) 25 MG tablet Take 1 tablet (25 mg total) by mouth once for 1 dose. May repeat in 2 hours if headache persists or recurs. 11/12/20 11/12/20  Chase Picket, MD    Physical Exam   Triage Vital Signs: ED Triage Vitals [05/31/22 2256]  Enc Vitals Group     BP 134/74     Pulse Rate (!) 103     Resp (!) 21     Temp (!) 100.9 F (38.3 C)     Temp Source Oral     SpO2 97 %     Weight 178 lb (80.7 kg)     Height '5\' 6"'$  (1.676 m)     Head Circumference      Peak Flow      Pain Score      Pain Loc      Pain Edu?      Excl. in [employer]?     Most recent vital signs: Vitals:   06/01/22 0155 06/01/22 0230  BP: 112/70 111/66  Pulse: 89  76  Resp: 20 18  Temp: 99.7 F (37.6 C)   SpO2: 98% 98%    CONSTITUTIONAL: Alert and oriented and responds appropriately to questions. Well-appearing; well-nourished HEAD: Normocephalic, atraumatic EYES: Conjunctivae clear, pupils appear equal, sclera nonicteric ENT: normal nose; moist mucous membranes NECK: Supple, normal ROM CARD: RRR; S1 and S2 appreciated; no murmurs, no clicks, no rubs, no gallops RESP: Normal chest excursion without splinting or tachypnea; breath sounds clear and equal bilaterally; no wheezes, no rhonchi, no rales, no hypoxia or respiratory distress, speaking full sentences ABD/GI: Normal bowel sounds; non-distended; soft, non-tender, no rebound, no guarding, no peritoneal signs BACK: The back appears normal EXT: Normal ROM in all joints; no deformity noted, no edema; no cyanosis SKIN: Normal color for age and race; warm; no rash on exposed skin NEURO: Moves all extremities equally, normal speech PSYCH: The patient's mood and manner are appropriate.   ED Results / Procedures / Treatments   LABS: (all labs ordered are listed, but only abnormal results are displayed) Labs Reviewed  RESP PANEL BY RT-PCR (FLU A&B, COVID) ARPGX2 - Abnormal;  Notable for the following components:      Result Value   SARS Coronavirus 2 by RT PCR POSITIVE (*)    All other components within normal limits  URINALYSIS, ROUTINE W REFLEX MICROSCOPIC - Abnormal; Notable for the following components:   Color, Urine YELLOW (*)    APPearance HAZY (*)    All other components within normal limits  BASIC METABOLIC PANEL - Abnormal; Notable for the following components:   CO2 19 (*)    All other components within normal limits  CBC WITH DIFFERENTIAL/PLATELET - Abnormal; Notable for the following components:   Lymphs Abs 0.4 (*)    All other components within normal limits  HEPATIC FUNCTION PANEL - Abnormal; Notable for the following components:   AST 13 (*)    All other components within  normal limits  URINE CULTURE  PROCALCITONIN  POC URINE PREG, ED  TROPONIN I (HIGH SENSITIVITY)  TROPONIN I (HIGH SENSITIVITY)     EKG:    Date: 05/31/2022 23:02  Rate: 106  Rhythm: sinus tachycardia  QRS Axis: normal  Intervals: normal  ST/T Wave abnormalities: normal  Conduction Disutrbances: none  Narrative Interpretation: Sinus tachycardia    RADIOLOGY: My personal review and interpretation of imaging: Chest x-ray shows possible bibasilar pneumonia but CTA of the chest shows no pneumonia or PE  I have personally reviewed all radiology reports.   CT Angio Chest PE W and/or Wo Contrast  Result Date: 06/01/2022 CLINICAL DATA:  Pulmonary embolism (PE) suspected, unknown D-dimer. Chest pain, dyspnea, nausea EXAM: CT ANGIOGRAPHY CHEST WITH CONTRAST TECHNIQUE: Multidetector CT imaging of the chest was performed using the standard protocol during bolus administration of intravenous contrast. Multiplanar CT image reconstructions and MIPs were obtained to evaluate the vascular anatomy. RADIATION DOSE REDUCTION: This exam was performed according to the departmental dose-optimization program which includes automated exposure control, adjustment of the mA and/or kV according to patient size and/or use of iterative reconstruction technique. CONTRAST:  33m OMNIPAQUE IOHEXOL 350 MG/ML SOLN COMPARISON:  None Available. FINDINGS: Cardiovascular: Adequate opacification of the pulmonary arterial tree. No intraluminal filling defect identified to suggest acute pulmonary embolism. Central pulmonary arteries are of normal caliber. No significant coronary artery calcification. Cardiac size within normal limits. No pericardial effusion. No significant atherosclerotic calcification within the thoracic aorta. No aortic aneurysm. Mediastinum/Nodes: No enlarged mediastinal, hilar, or axillary lymph nodes. Thyroid gland, trachea, and esophagus demonstrate no significant findings. Lungs/Pleura: Bibasilar  atelectasis. Lungs are otherwise clear. No pneumothorax or pleural effusion. No central obstructing lesion. Upper Abdomen: No acute abnormality. Musculoskeletal: No chest wall abnormality. No acute or significant osseous findings. Review of the MIP images confirms the above findings. IMPRESSION: No pulmonary embolism. No acute intrathoracic pathology identified. Electronically Signed   By: AFidela SalisburyM.D.   On: 06/01/2022 03:12   DG Chest 2 View  Result Date: 05/31/2022 CLINICAL DATA:  Chest pain, dyspnea EXAM: CHEST - 2 VIEW COMPARISON:  None Available. FINDINGS: Patchy bibasilar pulmonary infiltrate is present, possibly infectious in the acute setting. No pneumothorax or pleural effusion. Cardiac size within normal limits. Pulmonary vascularity is normal. No acute bone abnormality. IMPRESSION: Patchy bibasilar pulmonary infiltrate, possibly infectious in the acute setting. Electronically Signed   By: AFidela SalisburyM.D.   On: 05/31/2022 23:23     PROCEDURES:  Critical Care performed: No      .1-3 Lead EKG Interpretation  Performed by: Inaki Vantine, KDelice Bison DO Authorized by: Deone Omahoney, KDelice Bison DO     Interpretation: normal  ECG rate:  89   ECG rate assessment: normal     Rhythm: sinus rhythm     Ectopy: none     Conduction: normal       IMPRESSION / MDM / ASSESSMENT AND PLAN / ED COURSE  I reviewed the triage vital signs and the nursing notes.    Patient here with complaints of fevers, back pain, shortness of breath.  The patient is on the cardiac monitor to evaluate for evidence of arrhythmia and/or significant heart rate changes.   DIFFERENTIAL DIAGNOSIS (includes but not limited to):   Pneumonia, COVID, other viral URI, UTI, pyelonephritis, kidney stone, doubt epidural abscess or hematoma, discitis or osteomyelitis, transverse myelitis, meningitis   Patient's presentation is most consistent with acute presentation with potential threat to life or bodily  function.   PLAN: CBC, CMP, procalcitonin, troponin, urinalysis, chest x-ray COVID and flu swabs obtained from triage.  No leukocytosis.  Mild metabolic acidosis with normal anion gap.  Urine shows no infection.  Chest x-ray reviewed and interpreted by myself and the radiologist and shows bibasilar infiltrates e.  Procalcitonin 0.12.  Patient is COVID-positive.  Suspect symptoms due to COVID-pneumonia but she does appear quite uncomfortable on exam.  We will obtain a CTA of the chest to rule out PE.  Second troponin pending.  Will give Tylenol, Toradol for symptomatic relief.   MEDICATIONS GIVEN IN ED: Medications  ibuprofen (ADVIL) tablet 600 mg (600 mg Oral Given 06/01/22 0042)  ketorolac (TORADOL) 30 MG/ML injection 30 mg (30 mg Intravenous Given 06/01/22 0222)  acetaminophen (TYLENOL) tablet 1,000 mg (1,000 mg Oral Given 06/01/22 0222)  iohexol (OMNIPAQUE) 350 MG/ML injection 75 mL (75 mLs Intravenous Contrast Given 06/01/22 0258)     ED COURSE: CTA of the chest reviewed and interpreted by myself and the radiologist and shows no infiltrate or PE.  Patient has no hypoxia with rest or ambulation.  She states she is feeling better.  Will discharge with Paxlovid, supportive care instructions and a work note.  Patient comfortable with this plan.  Encouraged vaccination.   At this time, I do not feel there is any life-threatening condition present. I reviewed all nursing notes, vitals, pertinent previous records.  All lab and urine results, EKGs, imaging ordered have been independently reviewed and interpreted by myself.  I reviewed all available radiology reports from any imaging ordered this visit.  Based on my assessment, I feel the patient is safe to be discharged home without further emergent workup and can continue workup as an outpatient as needed. Discussed all findings, treatment plan as well as usual and customary return precautions.  They verbalize understanding and are comfortable with this plan.   Outpatient follow-up has been provided as needed.  All questions have been answered.    CONSULTS: Admission considered but patient is young, healthy, well-appearing without increased work of breathing, respiratory distress or hypoxia.  No signs of bacterial pneumonia.   OUTSIDE RECORDS REVIEWED: Reviewed patient's last OB/GYN note on 02/13/2018.       FINAL CLINICAL IMPRESSION(S) / ED DIAGNOSES   Final diagnoses:  Acute COVID-19     Rx / DC Orders   ED Discharge Orders          Ordered    nirmatrelvir/ritonavir EUA (PAXLOVID) 20 x 150 MG & 10 x '100MG'$  TABS  2 times daily        06/01/22 0331             Note:  This document  was prepared using Systems analyst and may include unintentional dictation errors.   Tanekia Ryans, Delice Bison, DO 06/01/22 [phone]

## 2022-06-01 NOTE — ED Notes (Signed)
E-signature pad unavailable - Pt verbalized understanding of D/C information - no additional concerns at this time.  

## 2022-06-01 NOTE — Discharge Instructions (Addendum)
You have been diagnosed with COVID 19.  This is a virus that can cause many different symptoms and can be extremely contagious.  Because it is a virus, you do not need antibiotics.  You may be eligible for outpatient antiviral treatments for COVID 19 such as Paxlovid, Monulpuravir if you are within the first 5 days of symptoms. You do not need antibiotics for COVID 19 since it is a virus.  You may use over the counter medications to help manage your symptoms at home.  You will need to quarantine from others for five days (first day of symptoms is DAY ZERO).  If your symptoms are improving or resolved at the end of this time frame, you may come out of quarantine but will need to wear a well fitted mask when around others for the next 5 days.   The best way to protect yourself and others from [place] 19 and potential long term complications is to be vaccinated and receive boosters as recommended by the [facility] and your primary care provider.  If you develop shortness of breath, blue lips or blue fingertips, vomiting that does not stop, chest pain, confusion, become severely weak or feel you may pass out, please return to the closest emergency department.  You may alternate Tylenol 1000 mg every 6 hours as needed for pain, fever (as long as you have no history of liver dysfunction) and Ibuprofen 800 mg every 8 hours as needed for pain, fever (as long as you have no history of kidney dysfunction).  Please take Ibuprofen with food.  Do not take more than 4000 mg of Tylenol (acetaminophen) in a 24 hour period.

## 2022-06-01 NOTE — ED Notes (Signed)
Pts ambulatory sats around the room remained at 98-99% on RA. Denies dyspnea.

## 2022-06-02 LAB — URINE CULTURE

## 2022-06-15 ENCOUNTER — Telehealth: Payer: Self-pay

## 2022-06-15 NOTE — Telephone Encounter (Signed)
Patient called and scheduled for PCP appointment. Pt has Managed Medicaid.

## 2022-06-22 NOTE — Telephone Encounter (Signed)
This encounter was created in error - please disregard.

## 2022-10-03 ENCOUNTER — Ambulatory Visit: Payer: Medicaid Other | Admitting: Family Medicine

## 2022-12-02 ENCOUNTER — Emergency Department
Admission: EM | Admit: 2022-12-02 | Discharge: 2022-12-03 | Disposition: A | Discharge status: Home / Self Care | Payer: Medicaid Other | Attending: Emergency Medicine | Admitting: Emergency Medicine

## 2022-12-02 ENCOUNTER — Encounter: Payer: Self-pay | Admitting: Radiology

## 2022-12-02 ENCOUNTER — Emergency Department: Payer: Medicaid Other

## 2022-12-02 DIAGNOSIS — J189 Pneumonia, unspecified organism: Secondary | ICD-10-CM | POA: Insufficient documentation

## 2022-12-02 DIAGNOSIS — R0789 Other chest pain: Secondary | ICD-10-CM | POA: Diagnosis present

## 2022-12-02 LAB — BASIC METABOLIC PANEL
Anion gap: 3 — ABNORMAL LOW (ref 5–15)
BUN: 11 mg/dL (ref 6–20)
CO2: 23 mmol/L (ref 22–32)
Calcium: 8.7 mg/dL — ABNORMAL LOW (ref 8.9–10.3)
Chloride: 106 mmol/L (ref 98–111)
Creatinine, Ser: 0.66 mg/dL (ref 0.44–1.00)
GFR, Estimated: >60 mL/min (ref >60)
Glucose, Bld: 87 mg/dL (ref 70–99)
Potassium: 3.7 mmol/L (ref 3.5–5.1)
Sodium: 132 mmol/L — ABNORMAL LOW (ref 135–145)

## 2022-12-02 LAB — HEPATIC FUNCTION PANEL
ALT: 15 U/L (ref 0–44)
AST: 17 U/L (ref 15–41)
Albumin: 3.9 g/dL (ref 3.5–5.0)
Alkaline Phosphatase: 55 U/L (ref 38–126)
Bilirubin, Direct: <0.1 mg/dL (ref 0.0–0.2)
Total Bilirubin: 0.4 mg/dL (ref 0.3–1.2)
Total Protein: 7.1 g/dL (ref 6.5–8.1)

## 2022-12-02 LAB — CBC
HCT: 36 % (ref 36.0–46.0)
Hemoglobin: 12.3 g/dL (ref 12.0–15.0)
MCH: 31.7 pg (ref 26.0–34.0)
MCHC: 34.2 g/dL (ref 30.0–36.0)
MCV: 92.8 fL (ref 80.0–100.0)
Platelets: 352 10*3/uL (ref 150–400)
RBC: 3.88 MIL/uL (ref 3.87–5.11)
RDW: 12.6 % (ref 11.5–15.5)
WBC: 9.3 10*3/uL (ref 4.0–10.5)
nRBC: 0 % (ref 0.0–0.2)

## 2022-12-02 LAB — TROPONIN I (HIGH SENSITIVITY): Troponin I (High Sensitivity): <2 ng/L (ref <18)

## 2022-12-02 LAB — LIPASE, BLOOD: Lipase: 60 U/L — ABNORMAL HIGH (ref 11–51)

## 2022-12-02 MED ORDER — KETOROLAC TROMETHAMINE 30 MG/ML IJ SOLN
15.0000 mg | Freq: Once | INTRAMUSCULAR | Status: AC
  Administered 2022-12-02: 15 mg via INTRAVENOUS
  Filled 2022-12-02: qty 1

## 2022-12-02 MED ORDER — IOHEXOL 350 MG/ML SOLN
75.0000 mL | Freq: Once | INTRAVENOUS | Status: AC | PRN
  Administered 2022-12-02: 75 mL via INTRAVENOUS

## 2022-12-02 MED ORDER — MORPHINE SULFATE (PF) 4 MG/ML IV SOLN
4.0000 mg | Freq: Once | INTRAVENOUS | Status: AC
  Administered 2022-12-02: 4 mg via INTRAVENOUS
  Filled 2022-12-02: qty 1

## 2022-12-02 NOTE — ED Triage Notes (Signed)
Pt to ED via EMS CC of chest pain while eating. Pt took 324 mg of Aspirin and 1 dose of Nitroglycerin prior to arrival. Pt denies N/V and does not have a hx of cardiac issues. VS stable at this time.

## 2022-12-03 LAB — TROPONIN I (HIGH SENSITIVITY): Troponin I (High Sensitivity): <2 ng/L (ref <18)

## 2022-12-03 MED ORDER — DOXYCYCLINE HYCLATE 100 MG PO TABS: 100.0000 mg | ORAL_TABLET | Freq: Two times a day (BID) | ORAL | 0 refills | Status: AC

## 2022-12-03 MED ORDER — DOXYCYCLINE HYCLATE 100 MG PO TABS
100.0000 mg | ORAL_TABLET | Freq: Once | ORAL | Status: AC
  Administered 2022-12-03: 100 mg via ORAL
  Filled 2022-12-03: qty 1

## 2022-12-03 NOTE — ED Provider Notes (Signed)
[facility] Provider Note    Event Date/Time   First MD Initiated Contact with Patient 12/02/22 2304     (approximate)   History   Chest Pain   HPI  Caitlin Young is a 32 y.o. female  here with chest pain. Pt reports that over the past day she has had aching, throbbing, right sided chest pain. The pain feels like a pressure in her right chest and she has had some numbness/"falling asleep" feeling in her right arm. Sx have been fairly constant since onset. They are worse with deep inspiration and some positions. She does note she had a cough the last week but no hemoptysis, no sputum production. No leg swelling. No h/o dvt or pe.       Physical Exam   Triage Vital Signs: ED Triage Vitals [12/02/22 2121]  Enc Vitals Group     BP 131/84     Pulse Rate 89     Resp 14     Temp 98.3 F (36.8 C)     Temp Source Oral     SpO2 98 %     Weight      Height      Head Circumference      Peak Flow      Pain Score 8     Pain Loc      Pain Edu?      Excl. in [place]?     Most recent vital signs: Vitals:   12/02/22 2121 12/02/22 2300  BP: 131/84 114/82  Pulse: 89 70  Resp: 14 17  Temp: 98.3 F (36.8 C)   SpO2: 98% 98%     General: Awake, no distress.  CV:  Good peripheral perfusion.  Resp:  Normal work of breathing. Chest wall tender over right anterior parasternal area. No bruising or deformity. Abd:  No distention.  Other:  Strength 5/5 bl UE proximally and distally. Normal grip strength. Normal sensation to light touch. Radial pulses 2+ and symmetric.   ED Results / Procedures / Treatments   Labs (all labs ordered are listed, but only abnormal results are displayed) Labs Reviewed  BASIC METABOLIC PANEL - Abnormal; Notable for the following components:      Result Value   Sodium 132 (*)    Calcium 8.7 (*)    Anion gap 3 (*)    All other components within normal limits  LIPASE, BLOOD - Abnormal; Notable for the following components:    Lipase 60 (*)    All other components within normal limits  CBC  HEPATIC FUNCTION PANEL  POC URINE PREG, ED  TROPONIN I (HIGH SENSITIVITY)  TROPONIN I (HIGH SENSITIVITY)     EKG Normal sinus rhythm, VR 89, PR 153, Qtc 458. No acute st elevations or depressions.   RADIOLOGY CXR: Clear no active disease CT Angio: Pending   I also independently reviewed and agree with radiologist interpretations.   PROCEDURES:  Critical Care performed: No   MEDICATIONS ORDERED IN ED: Medications  morphine (PF) 4 MG/ML injection 4 mg (4 mg Intravenous Given 12/02/22 2244)  ketorolac (TORADOL) 30 MG/ML injection 15 mg (15 mg Intravenous Given 12/02/22 2243)  iohexol (OMNIPAQUE) 350 MG/ML injection 75 mL (75 mLs Intravenous Contrast Given 12/02/22 2310)  doxycycline (VIBRA-TABS) tablet 100 mg (100 mg Oral Given 12/03/22 0035)     IMPRESSION / MDM / ASSESSMENT AND PLAN / ED COURSE  I reviewed the triage vital signs and the nursing notes.  Differential diagnosis includes, but is not limited to, atypical chest pain, MSK chest pain, PNA, PE, ACS, radicular pain  Patient's presentation is most consistent with acute presentation with potential threat to life or bodily function.  The patient is on the cardiac monitor to evaluate for evidence of arrhythmia and/or significant heart rate changes  32 yo F here with atypical right sided chest pain. Clinically, suspect MSK chest wall pain in setting of recent coughing. CXR is clear. EKG nonischemic and initial trop negative. Labs reassuring. DDx includes occult PNA, PE, esp given pleuritic nature of pain. Will check CT Angio. Pt satting well on RA and is otherwise in NAD.    FINAL CLINICAL IMPRESSION(S) / ED DIAGNOSES   Final diagnoses:  Other chest pain  Atypical pneumonia     Rx / DC Orders   ED Discharge Orders          Ordered    doxycycline (VIBRA-TABS) 100 MG tablet  2 times daily        12/03/22 0009              Note:  This document was prepared using Dragon voice recognition software and may include unintentional dictation errors.   [name], MD 12/03/22 [phone]

## 2022-12-03 NOTE — Discharge Instructions (Addendum)
Please take Tylenol and ibuprofen/Advil for your pain.  It is safe to take them together, or to alternate them every few hours.  Take up to '1000mg'$  of Tylenol at a time, up to 4 times per day.  Do not take more than 4000 mg of Tylenol in 24 hours.  For ibuprofen, take 400-600 mg, 3 - 4 times per day.  Take the doxycycline antibiotic twice daily for the next 1 week to treat a possible walking pneumonia contributing to your pain

## 2023-05-15 ENCOUNTER — Other Ambulatory Visit: Payer: Self-pay

## 2023-05-15 ENCOUNTER — Encounter: Payer: Self-pay | Admitting: Emergency Medicine

## 2023-05-15 DIAGNOSIS — U071 COVID-19: Secondary | ICD-10-CM | POA: Diagnosis not present

## 2023-05-15 DIAGNOSIS — J029 Acute pharyngitis, unspecified: Secondary | ICD-10-CM | POA: Diagnosis not present

## 2023-05-15 DIAGNOSIS — Z5321 Procedure and treatment not carried out due to patient leaving prior to being seen by health care provider: Secondary | ICD-10-CM | POA: Diagnosis not present

## 2023-05-15 MED ORDER — ACETAMINOPHEN 500 MG PO TABS
1000.0000 mg | ORAL_TABLET | Freq: Once | ORAL | Status: AC
  Administered 2023-05-15: 1000 mg via ORAL
  Filled 2023-05-15: qty 2

## 2023-05-15 NOTE — ED Triage Notes (Signed)
Patient ambulatory to triage with steady gait, without difficulty or distress noted; pt reports recent exposure to COVID; st since this am having HA, sore throat & congestion

## 2023-05-15 NOTE — ED Notes (Signed)
Pt requesting "something for headache" at this time. Eliberto Ivory, RN made aware.

## 2023-05-16 ENCOUNTER — Emergency Department
Admission: EM | Admit: 2023-05-16 | Discharge: 2023-05-16 | Discharge status: Against Medical Advice | Payer: 59 | Attending: Emergency Medicine | Admitting: Emergency Medicine

## 2023-05-16 LAB — RESP PANEL BY RT-PCR (RSV, FLU A&B, COVID)  RVPGX2
Influenza A by PCR: NEGATIVE
Influenza B by PCR: NEGATIVE
Resp Syncytial Virus by PCR: NEGATIVE
SARS Coronavirus 2 by RT PCR: NEGATIVE

## 2023-05-16 LAB — GROUP A STREP BY PCR: Group A Strep by PCR: NOT DETECTED

## 2023-05-16 NOTE — ED Notes (Signed)
No answer when called several times from lobby 

## 2023-08-01 ENCOUNTER — Ambulatory Visit: Payer: 59

## 2023-08-01 ENCOUNTER — Ambulatory Visit
Admission: EM | Admit: 2023-08-01 | Discharge: 2023-08-01 | Disposition: A | Discharge status: Home / Self Care | Payer: 59 | Attending: Family Medicine | Admitting: Family Medicine

## 2023-08-01 DIAGNOSIS — M545 Low back pain, unspecified: Secondary | ICD-10-CM | POA: Diagnosis not present

## 2023-08-01 DIAGNOSIS — S39012A Strain of muscle, fascia and tendon of lower back, initial encounter: Secondary | ICD-10-CM | POA: Diagnosis not present

## 2023-08-01 DIAGNOSIS — M438X5 Other specified deforming dorsopathies, thoracolumbar region: Secondary | ICD-10-CM | POA: Diagnosis not present

## 2023-08-01 MED ORDER — KETOROLAC TROMETHAMINE 60 MG/2ML IM SOLN
30.0000 mg | Freq: Once | INTRAMUSCULAR | Status: AC
  Administered 2023-08-01: 30 mg via INTRAMUSCULAR

## 2023-08-01 MED ORDER — NAPROXEN 500 MG PO TABS: 500.0000 mg | ORAL_TABLET | Freq: Two times a day (BID) | ORAL | 0 refills | Status: AC

## 2023-08-01 MED ORDER — CYCLOBENZAPRINE HCL 5 MG PO TABS: 5.0000 mg | ORAL_TABLET | Freq: Three times a day (TID) | ORAL | 0 refills | Status: AC | PRN
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
## 2023-10-09 ENCOUNTER — Ambulatory Visit: Payer: 59 | Admitting: Family Medicine

## 2023-10-09 ENCOUNTER — Encounter: Payer: Self-pay | Admitting: Family Medicine

## 2023-10-09 ENCOUNTER — Encounter: Payer: 59 | Admitting: Family Medicine

## 2023-10-09 DIAGNOSIS — Z113 Encounter for screening for infections with a predominantly sexual mode of transmission: Secondary | ICD-10-CM

## 2023-10-09 LAB — WET PREP FOR TRICH, YEAST, CLUE
Trichomonas Exam: NEGATIVE
Yeast Exam: NEGATIVE

## 2023-10-09 LAB — HM HIV SCREENING LAB: HM HIV Screening: NEGATIVE

## 2023-10-09 NOTE — Progress Notes (Signed)
 Erroneous-disgard

## 2023-10-09 NOTE — Progress Notes (Signed)
 [facility] Department STI clinic [address] [place] [address] Main phone: [phone]  STI screening visit  Subjective:  Caitlin Young is a 33 y.o. female being seen today for an STI screening visit. The patient reports they do not have symptoms.  Patient reports that they do not desire a pregnancy in the next year.   They reported they are not interested in discussing contraception today.    Patient's last menstrual period was 09/09/2023 (approximate).  Patient has the following medical conditions:   Patient Active Problem List   Diagnosis Date Noted   S/P cesarean section 04/09/2015   Migraines 09/23/2014   Overweight 08/17/2014   History of cervical dysplasia 11/20/2012    Chief Complaint  Patient presents with   SEXUALLY TRANSMITTED DISEASE    STI screening    HPI HPI Patient reports to clinic for STI testing- no symptoms  Does the patient using douching products? No  Last HIV test per patient/review of record was  Lab Results  Component Value Date   HMHIVSCREEN Negative - Validated 08/17/2014    Lab Results  Component Value Date   HIV NON REACTIVE 12/03/2017     Last HEPC test per patient/review of record was No results found for: HMHEPCSCREEN No components found for: HEPC   Last HEPB test per patient/review of record was No components found for: HMHEPBSCREEN No components found for: HEPC   Patient reports last pap was: years ago- encouraged to get provider for pap smears esp given previous Hx  No results found for: DIAGPAP, HPVHIGH, ADEQPAP No results found for: SPECADGYN Result Date Procedure Results Follow-ups  07/13/2016 HM PAP SMEAR HM Pap smear: Negative, HPV negative     Screening for MPX risk: Does the patient have an unexplained rash? No Is the patient MSM? No Does the patient endorse multiple sex partners or anonymous sex partners? No Did the patient have close or sexual contact  with a person diagnosed with MPX? No Has the patient traveled outside the [place]  where MPX is endemic? No Is there a high clinical suspicion for MPX-- evidenced by one of the following No  -Unlikely to be chickenpox  -Lymphadenopathy  -Rash that present in same phase of evolution on any given body part See flowsheet for further details and programmatic requirements.   Immunization history:  Immunization History  Administered Date(s) Administered   Tdap 05/27/2019     The following portions of the patient's history were reviewed and updated as appropriate: allergies, current medications, past medical history, past social history, past surgical history and problem list.  Objective:  There were no vitals filed for this visit.  Physical Exam Vitals and nursing note reviewed.  Constitutional:      Appearance: Normal appearance.  HENT:     Head: Normocephalic.     Mouth/Throat:     Mouth: Mucous membranes are moist.  Cardiovascular:     Rate and Rhythm: Normal rate.  Pulmonary:     Effort: Pulmonary effort is normal.  Abdominal:     Palpations: Abdomen is soft.  Genitourinary:    Comments: Declined genital exam- no symptoms, self swabbed Musculoskeletal:        General: Normal range of motion.  Lymphadenopathy:     Head:     Right side of head: No submandibular, preauricular or posterior auricular adenopathy.     Left side of head: No submandibular, preauricular or posterior auricular adenopathy.     Cervical: No cervical adenopathy.  Upper Body:     Right upper body: No supraclavicular or axillary adenopathy.     Left upper body: No supraclavicular or axillary adenopathy.  Skin:    General: Skin is warm and dry.  Neurological:     Mental Status: She is alert and oriented to person, place, and time.  Psychiatric:        Mood and Affect: Mood normal.     Assessment and Plan:  Caitlin Young is a 33 y.o. female presenting to the [facility] Department for  STI screening  1. Screening for venereal disease (Primary)  - HIV [place] [facility] - Syphilis Serology, [place] [facility] - Chlamydia/Gonorrhea [place] [facility] - WET PREP FOR TRICH, YEAST, CLUE   Patient accepted all screenings including vaginal CT/GC and bloodwork for HIV/RPR, and wet prep. Patient meets criteria for HepB screening? No. Ordered? not applicable Patient meets criteria for HepC screening? No. Ordered? not applicable  Treat wet prep per standing order Discussed time line for State Lab results and that patient will be called with positive results and encouraged patient to call if she had not heard in 2 weeks.  Counseled to return or seek care for continued or worsening symptoms Recommended repeat testing in 3 months with positive results. Recommended condom use with all sex for STI prevention.   Patient is currently using Sterilization by Laparoscopy to prevent pregnancy.    Return if symptoms worsen or fail to improve, for STI screening.  No future appointments.  Verneta Bers, [place]

## 2023-10-09 NOTE — Progress Notes (Signed)
 Not seen in Family planning due to having tubes removed.-Collins Scotland, RN

## 2023-10-09 NOTE — Progress Notes (Signed)
 Pt here for STI screening.  Wet mount results reviewed with patient.  No treatment needed at this time.  Condoms declined.-Collins Scotland, RN

## 2023-10-23 NOTE — Addendum Note (Signed)
Addended by: Berdie Ogren on: 10/23/2023 12:05 PM   Modules accepted: Orders

## 2024-10-26 ENCOUNTER — Emergency Department
Admission: EM | Admit: 2024-10-26 | Discharge: 2024-10-26 | Disposition: A | Discharge status: Home / Self Care | Payer: Self-pay | Attending: Emergency Medicine | Admitting: Emergency Medicine

## 2024-10-26 ENCOUNTER — Other Ambulatory Visit: Payer: Self-pay

## 2024-10-26 DIAGNOSIS — N611 Abscess of the breast and nipple: Secondary | ICD-10-CM | POA: Insufficient documentation

## 2024-10-26 DIAGNOSIS — L739 Follicular disorder, unspecified: Secondary | ICD-10-CM | POA: Insufficient documentation

## 2024-10-26 MED ORDER — DOXYCYCLINE HYCLATE 100 MG PO TABS: 100.0000 mg | ORAL_TABLET | Freq: Two times a day (BID) | ORAL | 0 refills | Status: AC

## 2024-10-26 NOTE — ED Triage Notes (Signed)
 Pt reports abscess to left breast that she noticed last night, pt denies drainage, area is hard and painful to the touch. Denies nipple discharge.

## 2024-10-26 NOTE — ED Provider Notes (Signed)
 "  [facility] Provider Note   Event Date/Time   First MD Initiated Contact with Patient 10/26/24 2310     (approximate) History  Abscess HPI Caitlin Young is a 34 y.o. female with history of migraines and cervical dysplasia who presents complaining of a irritated, painful, and erythematous area to the 6 o'clock position of the left areola underlying a hair follicle.  Patient states she first noticed this couple days ago and states that it has become more hard to touch and tender to palpation.  Patient denies any fever, nipple discharge, or recent breaks in the skin over this area ROS: Patient currently denies any vision changes, tinnitus, difficulty speaking, facial droop, sore throat, chest pain, shortness of breath, abdominal pain, nausea/vomiting/diarrhea, dysuria, or weakness/numbness/paresthesias in any extremity   Physical Exam  Triage Vital Signs: ED Triage Vitals  Encounter Vitals Group     BP 10/26/24 1948 121/67     Girls Systolic BP Percentile --      Girls Diastolic BP Percentile --      Boys Systolic BP Percentile --      Boys Diastolic BP Percentile --      Pulse Rate 10/26/24 1948 (!) 104     Resp 10/26/24 1948 18     Temp 10/26/24 1948 98.1 F (36.7 C)     Temp src --      SpO2 10/26/24 1948 100 %     Weight 10/26/24 1947 175 lb (79.4 kg)     Height 10/26/24 1947 5' 6 (1.676 m)     Head Circumference --      Peak Flow --      Pain Score 10/26/24 1946 5     Pain Loc --      Pain Education --      Exclude from Growth Chart --    Most recent vital signs: Vitals:   10/26/24 1948  BP: 121/67  Pulse: (!) 104  Resp: 18  Temp: 98.1 F (36.7 C)  SpO2: 100%   General: Awake, oriented x4. CV:  Good peripheral perfusion. Resp:  Normal effort. Abd:  No distention. Other:  Middle-aged overweight African-American female resting comfortably in no acute distress.  Small subcentimeter area of palpable induration below a hair follicle at the  6 o'clock position of the left areola without any surrounding erythema or active drainage ED Results / Procedures / Treatments  Labs (all labs ordered are listed, but only abnormal results are displayed) Labs Reviewed - No data to display PROCEDURES: Critical Care performed: No Procedures MEDICATIONS ORDERED IN ED: Medications - No data to display IMPRESSION / MDM / ASSESSMENT AND PLAN / ED COURSE  I reviewed the triage vital signs and the nursing notes.                             The patient is on the cardiac monitor to evaluate for evidence of arrhythmia and/or significant heart rate changes. Patient's presentation is most consistent with acute presentation with potential threat to life or bodily function. Patient 34 year old female who presents for a lesion to the left areola she is concerned may be an abscess.  On physical exam, this is likely early folliculitis versus carbuncle/furuncle along the areola.  Patient has no nipple drainage and the area is subcentimeter with no fluctuance.  Patient was given expectant management as well as instructions for warm compresses.  Patient was given a wait-and-see prescription  for doxycycline  if this does worsen or become an abscess.  Patient agrees with plan for discharge at this time and all questions were answered prior to discharge.  Patient given strict return precautions  Dispo: Discharge home with PCP follow-up as needed   FINAL CLINICAL IMPRESSION(S) / ED DIAGNOSES   Final diagnoses:  Folliculitis  Carbuncle of breast   Rx / DC Orders   ED Discharge Orders          Ordered    doxycycline  (VIBRA -TABS) 100 MG tablet  2 times daily        10/26/24 2316           Note:  This document was prepared using Dragon voice recognition software and may include unintentional dictation errors.   Adelai Achey K, MD 10/26/24 2321  "
